# Patient Record
Sex: Male | Born: 1965 | Race: White | Hispanic: No | Marital: Married | Smoking: Never smoker
Health system: Southern US, Community
[De-identification: ages and names within clinical notes are randomized; demographics above are authoritative.]

## PROBLEM LIST (undated history)

## (undated) DIAGNOSIS — R002 Palpitations: Secondary | ICD-10-CM

## (undated) DIAGNOSIS — R079 Chest pain, unspecified: Secondary | ICD-10-CM

## (undated) DIAGNOSIS — R9439 Abnormal result of other cardiovascular function study: Secondary | ICD-10-CM

## (undated) DIAGNOSIS — E7800 Pure hypercholesterolemia, unspecified: Secondary | ICD-10-CM

## (undated) HISTORY — DX: Palpitations: R00.2

## (undated) HISTORY — DX: Chest pain, unspecified: R07.9

## (undated) HISTORY — DX: Pure hypercholesterolemia, unspecified: E78.00

## (undated) HISTORY — PX: KIDNEY STONE SURGERY: SHX686

## (undated) HISTORY — PX: CHOLECYSTECTOMY: SHX55

## (undated) HISTORY — DX: Abnormal result of other cardiovascular function study: R94.39

## (undated) HISTORY — PX: HAND SURGERY: SHX662

---

## 1998-07-15 ENCOUNTER — Other Ambulatory Visit: Admission: RE | Admit: 1998-07-15 | Discharge: 1998-07-15 | Payer: Self-pay | Admitting: Otolaryngology

## 1999-05-29 ENCOUNTER — Other Ambulatory Visit: Admission: RE | Admit: 1999-05-29 | Discharge: 1999-05-29 | Payer: Self-pay | Admitting: Otolaryngology

## 2001-07-15 ENCOUNTER — Emergency Department (HOSPITAL_COMMUNITY): Admission: EM | Admit: 2001-07-15 | Discharge: 2001-07-15 | Payer: Self-pay | Admitting: Emergency Medicine

## 2001-07-15 ENCOUNTER — Encounter: Payer: Self-pay | Admitting: Emergency Medicine

## 2005-06-05 ENCOUNTER — Encounter: Admission: RE | Admit: 2005-06-05 | Discharge: 2005-06-05 | Payer: Self-pay | Admitting: Neurosurgery

## 2005-07-28 ENCOUNTER — Observation Stay (HOSPITAL_COMMUNITY): Admission: RE | Admit: 2005-07-28 | Discharge: 2005-07-30 | Payer: Self-pay | Admitting: Neurosurgery

## 2005-08-01 ENCOUNTER — Emergency Department (HOSPITAL_COMMUNITY): Admission: EM | Admit: 2005-08-01 | Discharge: 2005-08-01 | Payer: Self-pay | Admitting: *Deleted

## 2010-09-09 ENCOUNTER — Emergency Department (HOSPITAL_COMMUNITY)
Admission: EM | Admit: 2010-09-09 | Discharge: 2010-09-09 | Disposition: A | Discharge status: Home / Self Care | Payer: BC Managed Care – PPO | Attending: Emergency Medicine | Admitting: Emergency Medicine

## 2010-09-09 ENCOUNTER — Emergency Department (HOSPITAL_COMMUNITY): Payer: BC Managed Care – PPO

## 2010-09-09 DIAGNOSIS — S6990XA Unspecified injury of unspecified wrist, hand and finger(s), initial encounter: Secondary | ICD-10-CM | POA: Insufficient documentation

## 2010-09-09 DIAGNOSIS — M79609 Pain in unspecified limb: Secondary | ICD-10-CM | POA: Insufficient documentation

## 2010-09-09 DIAGNOSIS — S61209A Unspecified open wound of unspecified finger without damage to nail, initial encounter: Secondary | ICD-10-CM | POA: Insufficient documentation

## 2010-09-09 DIAGNOSIS — IMO0002 Reserved for concepts with insufficient information to code with codable children: Secondary | ICD-10-CM | POA: Insufficient documentation

## 2010-09-09 DIAGNOSIS — M7989 Other specified soft tissue disorders: Secondary | ICD-10-CM | POA: Insufficient documentation

## 2013-02-11 ENCOUNTER — Encounter: Payer: Self-pay | Admitting: Cardiology

## 2013-02-11 DIAGNOSIS — M545 Low back pain, unspecified: Secondary | ICD-10-CM

## 2013-02-17 ENCOUNTER — Ambulatory Visit (INDEPENDENT_AMBULATORY_CARE_PROVIDER_SITE_OTHER): Payer: BC Managed Care – PPO | Admitting: Cardiology

## 2013-02-17 ENCOUNTER — Encounter: Payer: Self-pay | Admitting: Cardiology

## 2013-02-17 VITALS — BP 148/79 | HR 82 | Ht 69.0 in | Wt 178.0 lb

## 2013-02-17 DIAGNOSIS — I471 Supraventricular tachycardia: Secondary | ICD-10-CM

## 2013-02-17 DIAGNOSIS — R002 Palpitations: Secondary | ICD-10-CM

## 2013-02-17 DIAGNOSIS — I4719 Other supraventricular tachycardia: Secondary | ICD-10-CM | POA: Insufficient documentation

## 2013-02-17 HISTORY — DX: Palpitations: R00.2

## 2013-02-17 MED ORDER — DIGOXIN 125 MCG PO TABS: 0.1250 mg | ORAL_TABLET | Freq: Every day | ORAL | Status: DC

## 2013-02-17 NOTE — Progress Notes (Signed)
[address], [address] [place], [place]  [address] Phone: [phone] Fax:  [phone]  Date:  02/17/2013   ID:  Andrew Pierce, DOB 1965/07/26, MRN [id number]  PCP:  Farris Has MD  History of Present Illness: Andrew Pierce is a 47 y.o. male here for evaluation of palpitations, lightheadedness, chest pain. He recently had a friend with a myocardial infarction and he is concerned. Several years ago he is evaluated by cardiology he states. Palpitations have started over the past year and half. 12oz of coffee in AM. Tea. No soft drinks. Been taking Claritin. No D.   His palpitations seem to be associated with anxiety either at work or at home and he has been focusing on relaxation techniques. Usually episodes last approximately 1 minute or 2. He has not had any frank syncope. Occasionally he may feel lightheaded with these episodes. In the past, he did not tolerate metoprolol. He had no energy at that time. His blood pressure was low approximately 100/60 on this medication. He stopped taking it. Chest discomfort occasionally comes about with associated left arm pain as well. Usually mild episodes.  Now with palpitations having chest pain episodes. He stops what he is doing and folds his arms and sits. Sometimes comes on with stress.   Started baby ASA.   He is a former smoker no early family history of CAD.   Wt Readings from Last 3 Encounters:  02/17/13 178 lb (80.74 kg)     Past Medical History  Diagnosis Date  . Elevated LDL cholesterol level   . Palpitations 02/17/2013    Event monitor 05/03/12-occasional tachycardia, paroxysmal atrial tachycardia, PACs heart rate at times in the 170s. Symptomatic.  Marland Kitchen Chest pain     Past Surgical History  Procedure Laterality Date  . Hand surgery Left     middle finger removed-table saw accident  . Cholecystectomy    . Kidney stone surgery      , Removal    Current Outpatient Prescriptions  Medication Sig Dispense  Refill  . aspirin 81 MG tablet Take 81 mg by mouth daily.      . naproxen sodium (ANAPROX) 220 MG tablet Take 220 mg by mouth every 12 (twelve) hours as needed.       No current facility-administered medications for this visit.    Allergies:    Allergies  Allergen Reactions  . Flexeril [Cyclobenzaprine] Anaphylaxis    Social History:  The patient  reports that he has never smoked. He does not have any smokeless tobacco history on file. He reports that  drinks alcohol. He reports that he does not use illicit drugs.  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 Family History  Problem Relation Age of Onset  . Diabetes Mother   . Hypertension Father   . Heart disease Paternal Grandmother     ROS:  Please see the history of present illness.   Denies any fevers, chills, cough, syncope, orthopnea, PND, strokelike symptoms, bleeding, arthritic changes the   All other systems reviewed and negative.   PHYSICAL EXAM: VS:  BP 148/79  Pulse 82  Ht 5\' 9"  (1.753 m)  Wt 178 lb (80.74 kg)  BMI 26.27 kg/m2 Well nourished, well developed, in no acute distress HEENT: normal, [place]/AT, EOMI Neck: no JVD, normal carotid upstroke, no bruit Cardiac:  normal S1, S2; RRR; no murmur Lungs:  clear to auscultation bilaterally, no wheezing, rhonchi or rales Abd: soft, nontender, no hepatomegaly, no  bruits Ext: no edema, 2+ distal pulses Skin: warm and dry GU: deferred Neuro: no focal abnormalities noted, AAO x 3  EKG:  Normal sinus rhythm, no ST segment changes. Heart rate 75. 02/17/13   Event monitor: 12/13-  brief episodes of paroxysmal atrial tachycardia, PACs. Heart rate 170 during episodes of PAT. Symptomatic.  ASSESSMENT AND PLAN:  47 year old male with palpitations, prior event monitor demonstrating paroxysmal atrial tachycardia, atypical chest pain, anxiety  1. Chest pain - his chest pain may be occurring during episodes of paroxysmal atrial tachycardia, heart rate of 170 previously  seen on event monitor. I would like to check an exercise treadmill test to ensure that there is no evidence of ischemia. Continue with low-dose aspirin. 2. Paroxysmal atrial tachycardia-did not tolerate metoprolol well do to fatigue and blood pressure of 100 systolic. We will try digoxin 125 mcg once a day. This should not affect his blood pressure. Hopefully this will help suppress arrhythmia. Another option in the future may be calcium channel blocker. I will check an echocardiogram to for proper structure and function. Try to avoid excessive caffeine use, decongestants. 3. Stress/anxiety-continue with relaxation techniques.  We will see him back in 2 months.  Signed, Donato Schultz, MD [facility]  02/17/2013 11:26 AM

## 2013-02-17 NOTE — Patient Instructions (Signed)
Your physician has recommended you make the following change in your medication:   1. Start Digoxin .125 mcg once daily  Your physician has requested that you have an exercise tolerance test. For further information please visit [email or web address] Please also follow instruction sheet, as given.  Your physician has requested that you have an echocardiogram. Echocardiography is a painless test that uses sound waves to create images of your heart. It provides your doctor with information about the size and shape of your heart and how well your heart's chambers and valves are working. This procedure takes approximately one hour. There are no restrictions for this procedure.  Your physician recommends that you schedule a follow-up appointment in: 2 months with Dr. Anne Fu.

## 2013-03-24 ENCOUNTER — Ambulatory Visit (INDEPENDENT_AMBULATORY_CARE_PROVIDER_SITE_OTHER): Payer: BC Managed Care – PPO | Admitting: Cardiology

## 2013-03-24 ENCOUNTER — Ambulatory Visit (HOSPITAL_COMMUNITY): Payer: BC Managed Care – PPO | Attending: Cardiology | Admitting: Radiology

## 2013-03-24 ENCOUNTER — Encounter: Payer: Self-pay | Admitting: *Deleted

## 2013-03-24 ENCOUNTER — Encounter: Payer: Self-pay | Admitting: Cardiology

## 2013-03-24 ENCOUNTER — Encounter (HOSPITAL_COMMUNITY): Payer: Self-pay | Admitting: Pharmacy Technician

## 2013-03-24 ENCOUNTER — Encounter (INDEPENDENT_AMBULATORY_CARE_PROVIDER_SITE_OTHER): Payer: Self-pay

## 2013-03-24 DIAGNOSIS — R9439 Abnormal result of other cardiovascular function study: Secondary | ICD-10-CM

## 2013-03-24 DIAGNOSIS — I4891 Unspecified atrial fibrillation: Secondary | ICD-10-CM | POA: Insufficient documentation

## 2013-03-24 DIAGNOSIS — Z8249 Family history of ischemic heart disease and other diseases of the circulatory system: Secondary | ICD-10-CM | POA: Insufficient documentation

## 2013-03-24 DIAGNOSIS — R000 Tachycardia, unspecified: Secondary | ICD-10-CM | POA: Insufficient documentation

## 2013-03-24 DIAGNOSIS — R002 Palpitations: Secondary | ICD-10-CM

## 2013-03-24 DIAGNOSIS — I471 Supraventricular tachycardia: Secondary | ICD-10-CM

## 2013-03-24 LAB — CBC WITH DIFFERENTIAL/PLATELET
Basophils Absolute: 0 10*3/uL (ref 0.0–0.1)
Eosinophils Absolute: 0.2 10*3/uL (ref 0.0–0.7)
Eosinophils Relative: 3.7 % (ref 0.0–5.0)
MCV: 90.9 fl (ref 78.0–100.0)
Monocytes Absolute: 0.4 10*3/uL (ref 0.1–1.0)
Neutrophils Relative %: 54.5 % (ref 43.0–77.0)
Platelets: 255 10*3/uL (ref 150.0–400.0)
WBC: 6.1 10*3/uL (ref 4.5–10.5)

## 2013-03-24 LAB — BASIC METABOLIC PANEL
BUN: 11 mg/dL (ref 6–23)
Chloride: 102 mEq/L (ref 96–112)
Creatinine, Ser: 0.9 mg/dL (ref 0.4–1.5)

## 2013-03-24 LAB — PROTIME-INR
INR: 1 ratio (ref 0.8–1.0)
Prothrombin Time: 10.6 s (ref 10.2–12.4)

## 2013-03-24 MED ORDER — SODIUM CHLORIDE 0.9 % IV SOLN: INTRAVENOUS | Status: DC

## 2013-03-24 NOTE — Progress Notes (Signed)
Exercise Treadmill Test  Pre-Exercise Testing Evaluation Rhythm: normal sinus  Rate: 65 BPM     Test  Exercise Tolerance Test Ordering MD: Donato Schultz, MD  Interpreting MD: Donato Schultz, MD  Unique Test No: 1  Treadmill:  1  Indication for ETT: palpitations  Contraindication to ETT: No   Stress Modality: exercise - treadmill  Cardiac Imaging Performed: non   Protocol: standard Bruce - maximal  Max BP:  153/63  Max MPHR (bpm):  172 85% MPR (bpm):  146  MPHR obtained (bpm):  153 % MPHR obtained:  88   Reached 85% MPHR (min:sec):  9 minutes 48 seconds  Total Exercise Time (min-sec):  10 minutes 18 seconds   Workload in METS:  12.2 Borg Scale: 19  Reason ETT Terminated:  atypical chest pain    ST Segment Analysis At Rest: normal ST segments - no evidence of significant ST depression With Exercise: significant ischemic ST depression seen in V5, as well as inferior leads  Other Information Arrhythmia:  No Angina during ETT:  present (1) Quality of ETT:  diagnostic  ETT Interpretation:  abnormal - evidence of ST depression consistent with ischemia  Comments: ST segment depression mostly horizontal noted during peak stress. He also had what he describes as intense/sharp chest discomfort at peak stress that resolved after approximately 1 minute at rest. He has been noticing this chest discomfort at home occasionally with exertional activity. He is also noted this when he feels his heart "racing". Because of his strong family history, abnormal treadmill findings, chest pain we will proceed with heart catheterization.  Recommendations: I discussed heart catheterization with him, risks and benefits including stroke, heart attack, death, renal impairment, bleeding. We will proceed with radial artery approach. Questions were answered.

## 2013-03-24 NOTE — Progress Notes (Signed)
Echocardiogram performed.  

## 2013-03-24 NOTE — Patient Instructions (Signed)
Your physician has requested that you have a Left cardiac catheterization. Cardiac catheterization is used to diagnose and/or treat various heart conditions. Doctors may recommend this procedure for a number of different reasons. The most common reason is to evaluate chest pain. Chest pain can be a symptom of coronary artery disease (CAD), and cardiac catheterization can show whether plaque is narrowing or blocking your heart's arteries. This procedure is also used to evaluate the valves, as well as measure the blood flow and oxygen levels in different parts of your heart. For further information please visit [email or web address] Please follow instruction sheet, as given.  Your physician recommends that you have Labs today: BMET,CBC,INR  Your physician recommends that you continue on your current medications as directed. Please refer to the Current Medication list given to you today.

## 2013-03-27 ENCOUNTER — Other Ambulatory Visit: Payer: Self-pay

## 2013-03-27 ENCOUNTER — Encounter (HOSPITAL_COMMUNITY)
Admission: RE | Disposition: A | Discharge status: Home / Self Care | Payer: Self-pay | Source: Ambulatory Visit | Attending: Cardiology

## 2013-03-27 ENCOUNTER — Ambulatory Visit (HOSPITAL_COMMUNITY)
Admission: RE | Admit: 2013-03-27 | Discharge: 2013-03-27 | Disposition: A | Discharge status: Home / Self Care | Payer: BC Managed Care – PPO | Source: Ambulatory Visit | Attending: Cardiology | Admitting: Cardiology

## 2013-03-27 DIAGNOSIS — R9439 Abnormal result of other cardiovascular function study: Secondary | ICD-10-CM | POA: Diagnosis present

## 2013-03-27 DIAGNOSIS — R0789 Other chest pain: Secondary | ICD-10-CM | POA: Insufficient documentation

## 2013-03-27 DIAGNOSIS — Z87891 Personal history of nicotine dependence: Secondary | ICD-10-CM | POA: Insufficient documentation

## 2013-03-27 DIAGNOSIS — R079 Chest pain, unspecified: Secondary | ICD-10-CM | POA: Diagnosis present

## 2013-03-27 HISTORY — PX: LEFT HEART CATHETERIZATION WITH CORONARY ANGIOGRAM: SHX5451

## 2013-03-27 SURGERY — LEFT HEART CATHETERIZATION WITH CORONARY ANGIOGRAM
Anesthesia: LOCAL

## 2013-03-27 MED ORDER — HEPARIN (PORCINE) IN NACL 2-0.9 UNIT/ML-% IJ SOLN
INTRAMUSCULAR | Status: AC
  Filled 2013-03-27: qty 1000

## 2013-03-27 MED ORDER — SODIUM CHLORIDE 0.9 % IV SOLN
INTRAVENOUS | Status: DC
  Administered 2013-03-27: 07:00:00 via INTRAVENOUS

## 2013-03-27 MED ORDER — ONDANSETRON HCL 4 MG/2ML IJ SOLN
4.0000 mg | Freq: Four times a day (QID) | INTRAMUSCULAR | Status: DC | PRN
Start: 2013-03-27 — End: 2013-03-27

## 2013-03-27 MED ORDER — SODIUM CHLORIDE 0.9 % IJ SOLN: 3.0000 mL | Freq: Two times a day (BID) | INTRAMUSCULAR | Status: DC

## 2013-03-27 MED ORDER — VERAPAMIL HCL 2.5 MG/ML IV SOLN
INTRAVENOUS | Status: AC
  Filled 2013-03-27: qty 2

## 2013-03-27 MED ORDER — DIAZEPAM 5 MG PO TABS
5.0000 mg | ORAL_TABLET | ORAL | Status: AC
  Administered 2013-03-27: 5 mg via ORAL

## 2013-03-27 MED ORDER — ACETAMINOPHEN 325 MG PO TABS: 650.0000 mg | ORAL_TABLET | ORAL | Status: DC | PRN

## 2013-03-27 MED ORDER — MIDAZOLAM HCL 2 MG/2ML IJ SOLN
INTRAMUSCULAR | Status: AC
  Filled 2013-03-27: qty 2

## 2013-03-27 MED ORDER — SODIUM CHLORIDE 0.9 % IJ SOLN: 3.0000 mL | INTRAMUSCULAR | Status: DC | PRN

## 2013-03-27 MED ORDER — DIAZEPAM 5 MG PO TABS
ORAL_TABLET | ORAL | Status: AC
  Filled 2013-03-27: qty 1

## 2013-03-27 MED ORDER — NITROGLYCERIN 0.2 MG/ML ON CALL CATH LAB
INTRAVENOUS | Status: AC
  Filled 2013-03-27: qty 1

## 2013-03-27 MED ORDER — FENTANYL CITRATE 0.05 MG/ML IJ SOLN
INTRAMUSCULAR | Status: AC
  Filled 2013-03-27: qty 2

## 2013-03-27 MED ORDER — SODIUM CHLORIDE 0.9 % IV SOLN: 1.0000 mL/kg/h | INTRAVENOUS | Status: DC

## 2013-03-27 MED ORDER — LIDOCAINE HCL (PF) 1 % IJ SOLN
INTRAMUSCULAR | Status: AC
  Filled 2013-03-27: qty 30

## 2013-03-27 MED ORDER — HEPARIN SODIUM (PORCINE) 1000 UNIT/ML IJ SOLN
INTRAMUSCULAR | Status: AC
  Filled 2013-03-27: qty 1

## 2013-03-27 MED ORDER — SODIUM CHLORIDE 0.9 % IV SOLN: 250.0000 mL | INTRAVENOUS | Status: DC | PRN

## 2013-03-27 MED ORDER — ASPIRIN 81 MG PO CHEW: 81.0000 mg | CHEWABLE_TABLET | ORAL | Status: DC

## 2013-03-27 NOTE — H&P (Signed)
[address]  [place], [place] [address]  Phone: [phone]  Fax: [phone]  Date: 02/17/2013  ID: Andrew Pierce, DOB 1965-09-26, MRN [id number]  PCP: Farris Has MD  History of Present Illness:  Andrew Pierce is a 47 y.o. male here for evaluation of palpitations, lightheadedness, chest pain. He recently had a friend with a myocardial infarction and he is concerned. Several years ago he is evaluated by cardiology he states. Palpitations have started over the past year and half. 12oz of coffee in AM. Tea. No soft drinks. Been taking Claritin. No D.  His palpitations seem to be associated with anxiety either at work or at home and he has been focusing on relaxation techniques. Usually episodes last approximately 1 minute or 2. He has not had any frank syncope. Occasionally he may feel lightheaded with these episodes. In the past, he did not tolerate metoprolol. He had no energy at that time. His blood pressure was low approximately 100/60 on this medication. He stopped taking it. Chest discomfort occasionally comes about with associated left arm pain as well. Usually mild episodes.  Now with palpitations having chest pain episodes. He stops what he is doing and folds his arms and sits. Sometimes comes on with stress.  Started baby ASA.  He is a former smoker no early family history of CAD.  Wt Readings from Last 3 Encounters:   02/17/13  178 lb (80.74 kg)    Past Medical History   Diagnosis  Date   .  Elevated LDL cholesterol level    .  Palpitations  02/17/2013     Event monitor 05/03/12-occasional tachycardia, paroxysmal atrial tachycardia, PACs heart rate at times in the 170s. Symptomatic.   Marland Kitchen  Chest pain     Past Surgical History   Procedure  Laterality  Date   .  Hand surgery  Left      middle finger removed-table saw accident   .  Cholecystectomy     .  Kidney stone surgery       , Removal    Current Outpatient Prescriptions   Medication  Sig  Dispense   Refill   .  aspirin 81 MG tablet  Take 81 mg by mouth daily.     .  naproxen sodium (ANAPROX) 220 MG tablet  Take 220 mg by mouth every 12 (twelve) hours as needed.      No current facility-administered medications for this visit.   Allergies:  Allergies   Allergen  Reactions   .  Flexeril [Cyclobenzaprine]  Anaphylaxis   Social History: The patient reports that he has never smoked. He does not have any smokeless tobacco history on file. He reports that drinks alcohol. He reports that he does not use illicit drugs.  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  Family History   Problem  Relation  Age of Onset   .  Diabetes  Mother    .  Hypertension  Father    .  Heart disease  Paternal Grandmother    ROS: Please see the history of present illness. Denies any fevers, chills, cough, syncope, orthopnea, PND, strokelike symptoms, bleeding, arthritic changes the All other systems reviewed and negative.  PHYSICAL EXAM:  VS: BP 148/79  Pulse 82  Ht 5\' 9"  (1.753 m)  Wt 178 lb (80.74 kg)  BMI 26.27 kg/m2  Well nourished, well developed, in no acute distress  HEENT: normal, [place]/AT, EOMI  Neck: no JVD, normal carotid upstroke,  no bruit  Cardiac: normal S1, S2; RRR; no murmur  Lungs: clear to auscultation bilaterally, no wheezing, rhonchi or rales  Abd: soft, nontender, no hepatomegaly, no bruits  Ext: no edema, 2+ distal pulses  Skin: warm and dry  GU: deferred  Neuro: no focal abnormalities noted, AAO x 3  EKG: Normal sinus rhythm, no ST segment changes. Heart rate 75. 02/17/13  Event monitor: 12/13- brief episodes of paroxysmal atrial tachycardia, PACs. Heart rate 170 during episodes of PAT. Symptomatic.  ASSESSMENT AND PLAN:  47 year old male with palpitations, prior event monitor demonstrating paroxysmal atrial tachycardia, atypical chest pain, anxiety  1. Chest pain - his chest pain may be occurring during episodes of paroxysmal atrial tachycardia, heart rate of 170 previously  seen on event monitor. I would like to check an exercise treadmill test to ensure that there is no evidence of ischemia. Continue with low-dose aspirin. 2. Paroxysmal atrial tachycardia-did not tolerate metoprolol well do to fatigue and blood pressure of 100 systolic. We will try digoxin 125 mcg once a day. This should not affect his blood pressure. Hopefully this will help suppress arrhythmia. Another option in the future may be calcium channel blocker. I will check an echocardiogram to for proper structure and function. Try to avoid excessive caffeine use, decongestants. 3. Stress/anxiety-continue with relaxation techniques. 4. ADDENDUM: ETT was abnormal, CP at peak exercise with ST depression in V5 as well as inferior leads. Proceed with cath. Discussed with patient.

## 2013-03-27 NOTE — Interval H&P Note (Signed)
History and Physical Interval Note:  03/27/2013 7:36 AM  Andrew Pierce  has presented today for surgery, with the diagnosis of abnormal stress  The various methods of treatment have been discussed with the patient and family. After consideration of risks, benefits and other options for treatment, the patient has consented to  Procedure(s): LEFT HEART CATHETERIZATION WITH CORONARY ANGIOGRAM (N/A) as a surgical intervention .  The patient's history has been reviewed, patient examined, no change in status, stable for surgery.  I have reviewed the patient's chart and labs.  Questions were answered to the patient's satisfaction.     SKAINS, MARK

## 2013-03-27 NOTE — Progress Notes (Signed)
Left message for patient to call the office

## 2013-03-27 NOTE — Interval H&P Note (Signed)
Cath Lab Visit (complete for each Cath Lab visit)  Clinical Evaluation Leading to the Procedure:   ACS: no  Non-ACS:    Anginal Classification: CCS II  Anti-ischemic medical therapy: No Therapy  Non-Invasive Test Results: High-risk stress test findings: cardiac mortality >3%/year ST depression, CP at peak stress.   Prior CABG: No previous CABG      History and Physical Interval Note:  03/27/2013 9:53 AM  Andrew Pierce  has presented today for surgery, with the diagnosis of abnormal stress  The various methods of treatment have been discussed with the patient and family. After consideration of risks, benefits and other options for treatment, the patient has consented to  Procedure(s): LEFT HEART CATHETERIZATION WITH CORONARY ANGIOGRAM (N/A) as a surgical intervention .  The patient's history has been reviewed, patient examined, no change in status, stable for surgery.  I have reviewed the patient's chart and labs.  Questions were answered to the patient's satisfaction.     Torianne Laflam

## 2013-03-27 NOTE — Progress Notes (Signed)
Patient ID: Andrew Pierce, male   DOB: 1965-05-26, 47 y.o.   MRN: [id number]  Please see ETT report for details  Abnormal ETT CP/Angina ST depression DIscussed Cath

## 2013-03-27 NOTE — CV Procedure (Signed)
    CARDIAC CATHETERIZATION  PROCEDURE:  Left heart catheterization with selective coronary angiography, left ventriculogram via the radial artery approach.  INDICATIONS:  47 year old male with chest discomfort, left arm discomfort at peak exercise also with chest discomfort possibly associated with supraventricular tachycardia who underwent exercise treadmill test and at peak exercise he developed sudden onset chest discomfort with left arm radiation lasting approximately 1 minute with concomitant ST segment depression on EKG. Because of this abnormal stress test, he was taken to the cardiac catheterization lab for further evaluation. Family history as well.  The risks, benefits, and details of the procedure were explained to the patient, including possibilities of stroke, heart attack, death, renal impairment, arterial damage, bleeding.  The patient verbalized understanding and wanted to proceed.  Informed written consent was obtained.  PROCEDURE TECHNIQUE:  Allen's test was performed pre-and post procedure and was normal. The right radial artery site was prepped and draped in a sterile fashion. One percent lidocaine was used for local anesthesia. Using the modified Seldinger technique a 5 French hydrophilic sheath was inserted into the radial artery without difficulty. 3 mg of verapamil was administered via the sheath. A Judkins right #4 catheter with the guidance of a Versicore wire was placed in the right coronary cusp and selectively cannulated the right coronary artery. After traversing the aortic arch, 4000 units of heparin IV was administered. A Judkins left #3.5 catheter was used to selectively cannulate the left main artery. Multiple views with hand injection of Omnipaque were obtained. Catheter a pigtail catheter was used to cross into the left ventricle, hemodynamics were obtained, and a left ventriculogram was performed in the RAO position with power injection. Following the procedure, sheath  was removed, patient was hemodynamically stable, hemostasis was maintained with a Terumo T band.   CONTRAST:  Total of 60 ml.    FLOUROSCOPY TIME: 3.6 min.  COMPLICATIONS:  None.    HEMODYNAMICS:  Aortic pressure was 107/48mmHg; LV systolic pressure was ; LVEDP .  There was no gradient between the left ventricle and aorta.    ANGIOGRAPHIC DATA:    Left main: No angiographically significant coronary artery disease.  Left anterior descending (LAD): No angiographically significant coronary artery disease. One significant diagonal branch.  Circumflex artery (CIRC): No angiographically significant coronary artery disease. 2 obtuse marginal branches  Right coronary artery (RCA): No angiographically significant coronary artery disease. Dominant vessel giving rise to PDA  LEFT VENTRICULOGRAM:  Left ventricular angiogram was done in the 30 RAO projection and revealed normal left ventricular wall motion and systolic function with an estimated ejection fraction of 65 %.   IMPRESSIONS:  No angiographically significant CAD Normal left ventricular systolic function.  LVEDP 16 mmHg.  Ejection fraction 65%.  RECOMMENDATION:  Continue with medical management. Reassurance. Overall false positive exercise treadmill test. Continue with aggressive primary prevention. He may resume to work Thursday.

## 2013-04-21 ENCOUNTER — Encounter: Payer: Self-pay | Admitting: Cardiology

## 2013-04-21 ENCOUNTER — Encounter (INDEPENDENT_AMBULATORY_CARE_PROVIDER_SITE_OTHER): Payer: Self-pay

## 2013-04-21 ENCOUNTER — Ambulatory Visit (INDEPENDENT_AMBULATORY_CARE_PROVIDER_SITE_OTHER): Payer: BC Managed Care – PPO | Admitting: Cardiology

## 2013-04-21 VITALS — BP 128/84 | HR 72 | Ht 69.0 in | Wt 182.4 lb

## 2013-04-21 DIAGNOSIS — R002 Palpitations: Secondary | ICD-10-CM

## 2013-04-21 DIAGNOSIS — R079 Chest pain, unspecified: Secondary | ICD-10-CM

## 2013-04-21 NOTE — Progress Notes (Signed)
[address] [place], [place]  [address] Phone: [phone] Fax:  [phone]  Date:  04/21/2013   ID:  ROGEN PORTE, DOB 02-14-1966, MRN [id number]  PCP:  Farris Has, MD MD  History of Present Illness: Andrew Pierce is a 47 y.o. male here for evaluation of palpitations, lightheadedness, chest pain. He recently had a friend with a myocardial infarction and he is concerned.  He underwent exercise treadmill test which was false positive, ST segment depression. Cardiac catheterization on 03/27/13 demonstrated normal arteries. Normal EF. Palpitations have started over the past year and half. 12oz of coffee in AM. Tea. No soft drinks. Been taking Claritin. No D.   His palpitations seem to be associated with anxiety either at work or at home and he has been focusing on relaxation techniques. Usually episodes last approximately 1 minute or 2. He has not had any frank syncope. Occasionally he may feel lightheaded with these episodes. In the past, he did not tolerate metoprolol. He had no energy at that time. His blood pressure was low approximately 100/60 on this medication. He stopped taking it. Chest discomfort occasionally comes about with associated left arm pain as well. Usually mild episodes.  Now with palpitations having chest pain episodes. He stops what he is doing and folds his arms and sits. Sometimes comes on with stress. Stressors at [employer].  Started baby ASA.   He is a former smoker no early family history of CAD.   Wt Readings from Last 3 Encounters:  04/21/13 182 lb 6.4 oz (82.736 kg)  03/27/13 171 lb (77.565 kg)  03/27/13 171 lb (77.565 kg)     Past Medical History  Diagnosis Date  . Elevated LDL cholesterol level   . Palpitations 02/17/2013    Event monitor 05/03/12-occasional tachycardia, paroxysmal atrial tachycardia, PACs heart rate at times in the 170s. Symptomatic.  Marland Kitchen Chest pain   . Abnormal stress test     Past Surgical History   Procedure Laterality Date  . Hand surgery Left     middle finger removed-table saw accident  . Cholecystectomy    . Kidney stone surgery      , Removal    Current Outpatient Prescriptions  Medication Sig Dispense Refill  . aspirin 81 MG tablet Take 81 mg by mouth daily.      . digoxin (LANOXIN) 0.125 MG tablet Take 1 tablet (0.125 mg total) by mouth daily.  30 tablet  3  . naproxen sodium (ALEVE) 220 MG tablet Take 220 mg by mouth daily as needed.       Current Facility-Administered Medications  Medication Dose Route Frequency Provider Last Rate Last Dose  . 0.9 %  sodium chloride infusion   Intravenous Continuous Donato Schultz, MD        Allergies:    Allergies  Allergen Reactions  . Flexeril [Cyclobenzaprine] Anaphylaxis    Social History:  The patient  reports that he has never smoked. He does not have any smokeless tobacco history on file. He reports that he drinks alcohol. He reports that he does not use illicit drugs.  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 Family History  Problem Relation Age of Onset  . Diabetes Mother   . Hypertension Father   . Heart disease Paternal Grandmother     ROS:  Please see the history of present illness.   Denies any fevers, chills, cough, syncope, orthopnea, PND, strokelike symptoms, bleeding, arthritic changes the  All other systems reviewed and negative.   PHYSICAL EXAM: VS:  BP 128/84  Pulse 72  Ht 5\' 9"  (1.753 m)  Wt 182 lb 6.4 oz (82.736 kg)  BMI 26.92 kg/m2 Well nourished, well developed, in no acute distress HEENT: normal, [place]/AT, EOMI Neck: no JVD, normal carotid upstroke, no bruit Cardiac:  normal S1, S2; RRR; no murmur Lungs:  clear to auscultation bilaterally, no wheezing, rhonchi or rales Abd: soft, nontender, no hepatomegaly, no bruits Ext: no edema, 2+ distal pulses Skin: warm and dry GU: deferred Neuro: no focal abnormalities noted, AAO x 3  EKG:  Normal sinus rhythm, no ST segment changes. Heart  rate 72. No changes   Event monitor: 12/13-  brief episodes of paroxysmal atrial tachycardia, PACs. Heart rate 170 during episodes of PAT. Symptomatic.  ASSESSMENT AND PLAN:  47 year old male with palpitations, prior event monitor demonstrating paroxysmal atrial tachycardia, atypical chest pain, anxiety  1. Chest pain - reassuring cardiac catheterization. No CAD. His chest pain may be occurring during episodes of paroxysmal atrial tachycardia, heart rate of 170 previously seen on event monitor. Pulse positive ETT. Likely anxiety driven. Continue with low-dose aspirin. 2. Paroxysmal atrial tachycardia-did not tolerate metoprolol well do to fatigue and blood pressure of 100 systolic. Digoxin 125 mcg once a day. Doing well with this.This should not affect his blood pressure. Hopefully this will help suppress arrhythmia. Another option in the future may be calcium channel blocker. I will check an echocardiogram to for proper structure and function. Try to avoid excessive caffeine use, decongestants. 3. Stress/anxiety-continue with relaxation techniques. 4. We will see back in 6 months given digoxin use.   Signed, Donato Schultz, MD [facility]  04/21/2013 10:06 AM

## 2013-04-21 NOTE — Patient Instructions (Signed)
Your physician recommends that you continue on your current medications as directed. Please refer to the Current Medication list given to you today.  Your physician wants you to follow-up in: 6 months with Dr. Skains. You will receive a reminder letter in the mail two months in advance. If you don't receive a letter, please call our office to schedule the follow-up appointment.  

## 2013-05-12 ENCOUNTER — Ambulatory Visit: Payer: BC Managed Care – PPO | Admitting: Cardiology

## 2013-07-18 ENCOUNTER — Other Ambulatory Visit: Payer: Self-pay

## 2013-07-18 MED ORDER — DIGOXIN 125 MCG PO TABS
0.1250 mg | ORAL_TABLET | Freq: Every day | ORAL | Status: DC
Start: 2013-07-18 — End: 2014-08-29

## 2014-04-26 ENCOUNTER — Encounter (HOSPITAL_COMMUNITY): Payer: Self-pay | Admitting: Cardiology

## 2014-08-29 ENCOUNTER — Other Ambulatory Visit: Payer: Self-pay | Admitting: Cardiology

## 2014-08-29 NOTE — Telephone Encounter (Signed)
OK to refill # 30 X 2.  Pt needs an appointment

## 2014-09-03 ENCOUNTER — Encounter: Payer: Self-pay | Admitting: Cardiology

## 2014-09-03 ENCOUNTER — Ambulatory Visit (INDEPENDENT_AMBULATORY_CARE_PROVIDER_SITE_OTHER): Payer: 59 | Admitting: Cardiology

## 2014-09-03 VITALS — BP 118/60 | HR 76 | Ht 69.0 in | Wt 188.0 lb

## 2014-09-03 DIAGNOSIS — R002 Palpitations: Secondary | ICD-10-CM

## 2014-09-03 DIAGNOSIS — I471 Supraventricular tachycardia: Secondary | ICD-10-CM | POA: Diagnosis not present

## 2014-09-03 NOTE — Patient Instructions (Signed)
The current medical regimen is effective;  continue present plan and medications.  Follow up in 1 year with Dr. Skains.  You will receive a letter in the mail 2 months before you are due.  Please call us when you receive this letter to schedule your follow up appointment.  Thank you for choosing [facility]!!     

## 2014-09-03 NOTE — Progress Notes (Signed)
[address] [place], [place]  [address] Phone: [phone] Fax:  [phone]  Date:  09/03/2014   ID:  Andrew Pierce, DOB 09/19/1965, MRN [id number]  PCP:  Farris Has, MD MD  History of Present Illness: Andrew Pierce is a 49 y.o. male here for evaluation of palpitations, lightheadedness, chest pain. He recently had a friend with a myocardial infarction and he is concerned.  He underwent exercise treadmill test which was false positive, ST segment depression. Cardiac catheterization on 03/27/13 demonstrated normal arteries. Normal EF. Palpitations have started over the past year and half. 12oz of coffee in AM. Tea. No soft drinks. Been taking Claritin. No D.   His palpitations seem to be associated with anxiety either at work or at home and he has been focusing on relaxation techniques. Usually episodes last approximately 1 minute or 2. He has not had any frank syncope. Occasionally he may feel lightheaded with these episodes. In the past, he did not tolerate metoprolol. He had no energy at that time. His blood pressure was low approximately 100/60 on this medication. He stopped taking it. Chest discomfort occasionally comes about with associated left arm pain as well. Usually mild episodes.  Now with palpitations having chest pain episodes. He stops what he is doing and folds his arms and sits. Sometimes comes on with stress. Stressors at [employer].  Started baby ASA.   He is a former smoker no early family history of CAD.  09/03/14-very rarely will he feel palpitations, occasionally with anxiety symptoms. Overall he has been doing very well on his digoxin therapy. No chest pain, no syncope, no shortness of breath.   Wt Readings from Last 3 Encounters:  09/03/14 188 lb (85.276 kg)  04/21/13 182 lb 6.4 oz (82.736 kg)  03/27/13 171 lb (77.565 kg)     Past Medical History  Diagnosis Date  . Elevated LDL cholesterol level   . Palpitations 02/17/2013   Event monitor 05/03/12-occasional tachycardia, paroxysmal atrial tachycardia, PACs heart rate at times in the 170s. Symptomatic.  Marland Kitchen Chest pain   . Abnormal stress test     Past Surgical History  Procedure Laterality Date  . Hand surgery Left     middle finger removed-table saw accident  . Cholecystectomy    . Kidney stone surgery      , Removal  . Left heart catheterization with coronary angiogram N/A 03/27/2013    Procedure: LEFT HEART CATHETERIZATION WITH CORONARY ANGIOGRAM;  Surgeon: Donato Schultz, MD;  Location: [facility] CATH LAB;  Service: Cardiovascular;  Laterality: N/A;    Current Outpatient Prescriptions  Medication Sig Dispense Refill  . aspirin 81 MG tablet Take 81 mg by mouth daily.    Marland Kitchen DIGOX 125 MCG tablet TAKE 1 TABLET BY MOUTH EVERY DAY 30 tablet 2  . naproxen sodium (ALEVE) 220 MG tablet Take 220 mg by mouth daily as needed.     Current Facility-Administered Medications  Medication Dose Route Frequency Provider Last Rate Last Dose  . 0.9 %  sodium chloride infusion   Intravenous Continuous Jake Bathe, MD        Allergies:    Allergies  Allergen Reactions  . Flexeril [Cyclobenzaprine] Anaphylaxis    Social History:  The patient  reports that he has never smoked. He does not have any smokeless tobacco history on file. He reports that he drinks alcohol. He reports that he does not use illicit drugs.  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 Family History  Problem Relation Age of Onset  . Diabetes Mother   . Hypertension Father   . Heart disease Paternal Grandmother   . Heart attack Paternal Grandmother   . Stroke Paternal Grandmother     ROS:  Please see the history of present illness.   Denies any fevers, chills, cough, syncope, orthopnea, PND, strokelike symptoms, bleeding, arthritic changes the   All other systems reviewed and negative.   PHYSICAL EXAM: VS:  BP 118/60 mmHg  Pulse 76  Ht 5\' 9"  (1.753 m)  Wt 188 lb (85.276 kg)  BMI 27.75  kg/m2 Well nourished, well developed, in no acute distress HEENT: normal, [place]/AT, EOMI Neck: no JVD, normal carotid upstroke, no bruit Cardiac:  normal S1, S2; RRR; no murmur Lungs:  clear to auscultation bilaterally, no wheezing, rhonchi or rales Abd: soft, nontender, no hepatomegaly, no bruits Ext: no edema, 2+ distal pulses Skin: warm and dry GU: deferred Neuro: no focal abnormalities noted, AAO x 3  EKG: 09/03/14-sinus rhythm, 76, no other abnormalities. Normal sinus rhythm, no ST segment changes. Heart rate 72. No changes   Event monitor: 12/13-  brief episodes of paroxysmal atrial tachycardia, PACs. Heart rate 170 during episodes of PAT. Symptomatic.  ECHO 03/24/13 - 50% EF.  ASSESSMENT AND PLAN:  49 year old male with palpitations, prior event monitor demonstrating paroxysmal atrial tachycardia, atypical chest pain, anxiety  1. No further Chest pain - reassuring cardiac catheterization. No CAD. His chest pain may be occurring during episodes of paroxysmal atrial tachycardia, heart rate of 170 previously seen on event monitor. False positive ETT. Likely anxiety driven. Continue with low-dose aspirin. 2. Paroxysmal atrial tachycardia-did not tolerate metoprolol well do to fatigue and blood pressure of 100 systolic. Digoxin 125 mcg once a day. Doing well with this.This should not affect his blood pressure. Hopefully this will help suppress arrhythmia. Another option in the future may be calcium channel blocker. Try to avoid excessive caffeine use, decongestants. 3. Stress/anxiety-continue with relaxation techniques. 4. We will see back in 12 months given digoxin use.   Signed, Donato SchultzMark Skains, MD [facility]  09/03/2014 4:12 PM

## 2014-11-23 ENCOUNTER — Telehealth: Payer: Self-pay | Admitting: Cardiology

## 2014-11-23 ENCOUNTER — Encounter: Payer: Self-pay | Admitting: Cardiology

## 2014-11-23 NOTE — Telephone Encounter (Signed)
Walk in pt form-[facility] paper dropped off pt states need done today. Gave to Pam/Skains

## 2014-11-23 NOTE — Telephone Encounter (Signed)
Dr.Skains completed letter for pt for [facility] scanned in faxed and left pt VM making him aware.

## 2015-03-06 ENCOUNTER — Other Ambulatory Visit: Payer: Self-pay | Admitting: Cardiology

## 2015-09-16 ENCOUNTER — Encounter: Payer: Self-pay | Admitting: Cardiology

## 2015-09-16 ENCOUNTER — Ambulatory Visit (INDEPENDENT_AMBULATORY_CARE_PROVIDER_SITE_OTHER): Payer: BLUE CROSS/BLUE SHIELD | Admitting: Cardiology

## 2015-09-16 VITALS — BP 126/74 | HR 73 | Ht 69.0 in | Wt 203.0 lb

## 2015-09-16 DIAGNOSIS — R002 Palpitations: Secondary | ICD-10-CM | POA: Diagnosis not present

## 2015-09-16 DIAGNOSIS — I471 Supraventricular tachycardia: Secondary | ICD-10-CM

## 2015-09-16 DIAGNOSIS — G4489 Other headache syndrome: Secondary | ICD-10-CM | POA: Diagnosis not present

## 2015-09-16 NOTE — Progress Notes (Signed)
[address] [place], [place]  [address] Phone: [phone] Fax:  [phone]  Date:  09/16/2015   ID:  Andrew Pierce, DOB [birth year], MRN [id number]  PCP:  Andrew HasMORROW, AARON, MD MD  History of Present Illness: Andrew Pierce is a 50 y.o. male here for evaluation of palpitations, lightheadedness, chest pain. He recently had a friend with a myocardial infarction and he is concerned.  He underwent exercise treadmill test which was false positive, ST segment depression. Cardiac catheterization on 03/27/13 demonstrated normal arteries. Normal EF. Palpitations have started over the past year and half. 12oz of coffee in AM. Tea. No soft drinks. Been taking Claritin. No D.   His palpitations seem to be associated with anxiety either at work or at home and he has been focusing on relaxation techniques. Usually episodes last approximately 1 minute or 2. He has not had any frank syncope. Occasionally he may feel lightheaded with these episodes. In the past, he did not tolerate metoprolol. He had no energy at that time. His blood pressure was low approximately 100/60 on this medication. He stopped taking it. Chest discomfort occasionally comes about with associated left arm pain as well. Usually mild episodes.  Now with palpitations having chest pain episodes. He stops what he is doing and folds his arms and sits. Sometimes comes on with stress. Stressors at [employer]/home.  Started baby ASA.   He is a former smoker no early family history of CAD.  09/03/14-very rarely will he feel palpitations, occasionally with anxiety symptoms. Overall he has been doing very well on his digoxin therapy. No chest pain, no syncope, no shortness of breath.  09/16/15 - Bad HA past 2 weeks. Goodies helped a little. +SOB, fatigue, CP again. Wonders if barbeque triggered it. Left sided HA. Improved. No palpitations. He did feel that his headache worsened with activity. His blood pressure has been normal. EKG  today reassuring.   Wt Readings from Last 3 Encounters:  09/16/15 203 lb (92.08 kg)  09/03/14 188 lb (85.276 kg)  04/21/13 182 lb 6.4 oz (82.736 kg)     Past Medical History  Diagnosis Date  . Elevated LDL cholesterol level   . Palpitations 02/17/2013    Event monitor 05/03/12-occasional tachycardia, paroxysmal atrial tachycardia, PACs heart rate at times in the 170s. Symptomatic.  Marland Kitchen. Chest pain   . Abnormal stress test     Past Surgical History  Procedure Laterality Date  . Hand surgery Left     middle finger removed-table saw accident  . Cholecystectomy    . Kidney stone surgery      , Removal  . Left heart catheterization with coronary angiogram N/A 03/27/2013    Procedure: LEFT HEART CATHETERIZATION WITH CORONARY ANGIOGRAM;  Surgeon: Donato SchultzMark Dalilah Curlin, MD;  [facility];  Service: Cardiovascular;  Laterality: N/A;    Current Outpatient Prescriptions  Medication Sig Dispense Refill  . aspirin 81 MG tablet Take 81 mg by mouth daily.    Marland Kitchen. DIGOX 125 MCG tablet TAKE 1 TABLET BY MOUTH EVERY DAY 30 tablet 6  . naproxen sodium (ALEVE) 220 MG tablet Take 220 mg by mouth daily as needed.     Current Facility-Administered Medications  Medication Dose Route Frequency Provider Last Rate Last Dose  . 0.9 %  sodium chloride infusion   Intravenous Continuous Jake BatheMark C Anistyn Graddy, MD        Allergies:    Allergies  Allergen Reactions  . Flexeril [  Cyclobenzaprine] Anaphylaxis    Social History:  The patient  reports that he has never smoked. He does not have any smokeless tobacco history on file. He reports that he drinks alcohol. He reports that he does not use illicit drugs.  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 Family History  Problem Relation Age of Onset  . Diabetes Mother   . Hypertension Father   . Heart disease Paternal Grandmother   . Heart attack Paternal Grandmother   . Stroke Paternal Grandmother     ROS:  Please see the history of present illness.    Denies any fevers, chills, cough, syncope, orthopnea, PND, strokelike symptoms, bleeding, arthritic changes the   All other systems reviewed and negative.   PHYSICAL EXAM: VS:  BP 126/74 mmHg  Pulse 73  Ht  (1.753 m)  Wt 203 lb (92.08 kg)  BMI 29.96 kg/m2 Well nourished, well developed, in no acute distress HEENT: normal, [place]/AT, EOMI Neck: no JVD, normal carotid upstroke, no bruit Cardiac:  normal S1, S2; RRR; no murmur Lungs:  clear to auscultation bilaterally, no wheezing, rhonchi or rales Abd: soft, nontender, no hepatomegaly, no bruits Ext: no edema, 2+ distal pulses Skin: warm and dry GU: deferred Neuro: no focal abnormalities noted, AAO x 3  EKG: EKG was ordered today. 09/16/15-sinus rhythm, 73, no other abnormality is personally viewed-prior 09/03/14-sinus rhythm, 76, no other abnormalities. Normal sinus rhythm, no ST segment changes. Heart rate 72. No changes   Event monitor: 12/13-  brief episodes of paroxysmal atrial tachycardia, PACs. Heart rate 170 during episodes of PAT. Symptomatic.  ECHO 03/24/13 - 50% EF.  Cardiac catheterization 03/2013-normal coronary arteries, normal EF.  ASSESSMENT AND PLAN:  50 year old male with palpitations, prior event monitor demonstrating paroxysmal atrial tachycardia, atypical chest pain, anxiety  1. Chest pain - reassuring cardiac catheterization. No CAD. His chest pain may be occurring during episodes of paroxysmal atrial tachycardia, heart rate of 170 previously seen on event monitor. False positive ETT. Cardiac catheterization 2014 normal. Likely anxiety driven. Continue with low-dose aspirin. 2. Paroxysmal atrial tachycardia-did not tolerate metoprolol well do to fatigue and blood pressure of 100 systolic. We will stop Digoxin 125 mcg once a day. Has not had any another episode. Another option in the future may be calcium channel blocker. Try to avoid excessive caffeine use, decongestants. He does not have atrial  fibrillation. 3. Stress/anxiety-continue with relaxation techniques. if headache does not improve, please discuss further with Dr. Kateri Plummer. No rashes seen.  4. We will see back in 12 months. He should be able to drive without difficulty. DOT.   Signed, Donato Schultz, MD [facility]  09/16/2015 11:33 AM

## 2015-09-16 NOTE — Patient Instructions (Signed)
Medication Instructions:  Stop Digoxin.  Labwork: None   Testing/Procedures: None  Follow-Up: Your physician wants you to follow-up in: 1 year with Dr Anne FuSkains. You will receive a reminder letter in the mail two months in advance. If you don't receive a letter, please call our office to schedule the follow-up appointment.        If you need a refill on your cardiac medications before your next appointment, please call your pharmacy.

## 2015-11-21 DIAGNOSIS — L309 Dermatitis, unspecified: Secondary | ICD-10-CM | POA: Diagnosis not present

## 2016-04-29 DIAGNOSIS — Z23 Encounter for immunization: Secondary | ICD-10-CM | POA: Diagnosis not present

## 2016-04-29 DIAGNOSIS — Z0000 Encounter for general adult medical examination without abnormal findings: Secondary | ICD-10-CM | POA: Diagnosis not present

## 2016-05-05 DIAGNOSIS — Z0000 Encounter for general adult medical examination without abnormal findings: Secondary | ICD-10-CM | POA: Diagnosis not present

## 2016-05-05 DIAGNOSIS — E785 Hyperlipidemia, unspecified: Secondary | ICD-10-CM | POA: Diagnosis not present

## 2016-05-05 DIAGNOSIS — Z125 Encounter for screening for malignant neoplasm of prostate: Secondary | ICD-10-CM | POA: Diagnosis not present

## 2016-10-29 ENCOUNTER — Ambulatory Visit (INDEPENDENT_AMBULATORY_CARE_PROVIDER_SITE_OTHER): Payer: BLUE CROSS/BLUE SHIELD | Admitting: Physician Assistant

## 2016-10-29 DIAGNOSIS — W293XXA Contact with powered garden and outdoor hand tools and machinery, initial encounter: Secondary | ICD-10-CM

## 2016-10-29 DIAGNOSIS — S41112A Laceration without foreign body of left upper arm, initial encounter: Secondary | ICD-10-CM

## 2016-10-29 NOTE — Progress Notes (Signed)
   Andrew Pierce  MRN: [id number] DOB: 1966/04/21  PCP: Farris HasMorrow, Aaron, MD  Subjective:  Pt is a pleasant 51 year old male who presents to clinic for wound on left arm. He was cleaning a chain saw today. It was running, he flipped it around and it cut his arm. Bleeding was well controlled. He has not cleaned it.  UTD tdap.  Denies n/t, muscle weakness, abnormal sensation, uncontrolled bleeding.   Review of Systems  Skin: Positive for wound.  Neurological: Negative for dizziness, weakness, numbness and headaches.    Patient Active Problem List   Diagnosis Date Noted  . Chest pain 03/27/2013  . Abnormal stress test   . Palpitations 02/17/2013  . PAT (paroxysmal atrial tachycardia) (HCC) 02/17/2013  . Lower back pain 02/11/2013    Current Outpatient Prescriptions on File Prior to Visit  Medication Sig Dispense Refill  . aspirin 81 MG tablet Take 81 mg by mouth daily.    . naproxen sodium (ALEVE) 220 MG tablet Take 220 mg by mouth daily as needed.    . [DISCONTINUED] DIGOX 125 MCG tablet TAKE 1 TABLET BY MOUTH EVERY DAY 30 tablet 6   Current Facility-Administered Medications on File Prior to Visit  Medication Dose Route Frequency Provider Last Rate Last Dose  . 0.9 %  sodium chloride infusion   Intravenous Continuous Jake BatheSkains, Mark C, MD        Allergies  Allergen Reactions  . Flexeril [Cyclobenzaprine] Anaphylaxis     Objective:  There were no vitals taken for this visit.  Physical Exam  Constitutional: He is oriented to person, place, and time and well-developed, well-nourished, and in no distress. No distress.  Cardiovascular: Normal rate, regular rhythm and normal heart sounds.   Neurological: He is alert and oriented to person, place, and time. GCS score is 15.  Skin: Skin is warm and dry.     Psychiatric: Mood, memory, affect and judgment normal.  Vitals reviewed.  Procedure: Verbal consent obtained. Skin was anesthetized with 3cc lidocaine with eipnephrine and  cleaned with soap and watere. Wound was explored and no deep structures involved. Laceration was sutured with 6 sutures. Wound was dressed and wound care discussed.  Assessment and Plan :  1. Laceration of left upper extremity, initial encounter 2. Contact with chainsaw as cause of accidental injury - Laceration from chainsaw blade causing superficial laceration to left arm. 6 sutures in place. Wound care discussed. RTC in 7 days for recheck.   Marco CollieWhitney Britt Theard, PA-C  [facility] Care at [facility] 10/29/2016 1:18 PM

## 2016-11-05 ENCOUNTER — Ambulatory Visit (INDEPENDENT_AMBULATORY_CARE_PROVIDER_SITE_OTHER): Payer: BLUE CROSS/BLUE SHIELD | Admitting: Physician Assistant

## 2016-11-05 VITALS — BP 113/74 | HR 78 | Temp 98.6°F | Resp 16 | Ht 69.0 in | Wt 202.2 lb

## 2016-11-05 DIAGNOSIS — Z4802 Encounter for removal of sutures: Secondary | ICD-10-CM

## 2016-11-05 NOTE — Progress Notes (Signed)
   Andrew PootClifton C Cisek  MRN: [id number] DOB: 1965-12-06  PCP: Farris HasMorrow, Aaron, MD  Subjective:  Pt presents to clinic for suture removal. He was here one week ago after he suffered a laceration from a chainsaw to his left forearm. He is feeling well.  Denies swelling, pain, redness, drainage of pus or bleeding.   Review of Systems  Constitutional: Negative for chills and fever.  Skin: Positive for wound.  Neurological: Negative for weakness.    Patient Active Problem List   Diagnosis Date Noted  . Chest pain 03/27/2013  . Abnormal stress test   . Palpitations 02/17/2013  . PAT (paroxysmal atrial tachycardia) (HCC) 02/17/2013  . Lower back pain 02/11/2013    Current Outpatient Prescriptions on File Prior to Visit  Medication Sig Dispense Refill  . aspirin 81 MG tablet Take 81 mg by mouth daily.    . naproxen sodium (ALEVE) 220 MG tablet Take 220 mg by mouth daily as needed.    . [DISCONTINUED] DIGOX 125 MCG tablet TAKE 1 TABLET BY MOUTH EVERY DAY 30 tablet 6   Current Facility-Administered Medications on File Prior to Visit  Medication Dose Route Frequency Provider Last Rate Last Dose  . 0.9 %  sodium chloride infusion   Intravenous Continuous Jake BatheSkains, Mark C, MD        Allergies  Allergen Reactions  . Flexeril [Cyclobenzaprine] Anaphylaxis     Objective:  BP 113/74   Pulse 78   Temp 98.6 F (37 C) (Oral)   Resp 16   Ht 5\' 9"  (1.753 m)   Wt 202 lb 3.2 oz (91.7 kg)   SpO2 99%   BMI 29.86 kg/m   Physical Exam  Constitutional: He is oriented to person, place, and time and well-developed, well-nourished, and in no distress. No distress.  Cardiovascular: Normal rate, regular rhythm and normal heart sounds.   Neurological: He is alert and oriented to person, place, and time. GCS score is 15.  Skin: Skin is warm and dry.     Psychiatric: Mood, memory, affect and judgment normal.  Vitals reviewed.  Procedure: 6 sutures removed without complication left forearm. Wound  care discussed.  Assessment and Plan :  1. Encounter for removal of sutures - 6 sutures removed without complication left forearm. RTC precautions discussed.   Marco CollieWhitney Ved Martos, PA-C  [facility] at [facility] 11/05/2016 11:53 AM

## 2016-11-05 NOTE — Patient Instructions (Signed)
     IF you received an x-ray today, you will receive an invoice from [facility]. Please contact [facility] at [phone] with questions or concerns regarding your invoice.   IF you received labwork today, you will receive an invoice from [facility]. Please contact [facility] at [phone] with questions or concerns regarding your invoice.   Our billing staff will not be able to assist you with questions regarding bills from these companies.  You will be contacted with the lab results as soon as they are available. The fastest way to get your results is to activate your My Chart account. Instructions are located on the last page of this paperwork. If you have not heard from us regarding the results in 2 weeks, please contact this office.     

## 2017-05-04 DIAGNOSIS — Z23 Encounter for immunization: Secondary | ICD-10-CM | POA: Diagnosis not present

## 2017-05-04 DIAGNOSIS — Z131 Encounter for screening for diabetes mellitus: Secondary | ICD-10-CM | POA: Diagnosis not present

## 2017-05-04 DIAGNOSIS — Z125 Encounter for screening for malignant neoplasm of prostate: Secondary | ICD-10-CM | POA: Diagnosis not present

## 2017-05-04 DIAGNOSIS — Z0000 Encounter for general adult medical examination without abnormal findings: Secondary | ICD-10-CM | POA: Diagnosis not present

## 2017-05-04 DIAGNOSIS — E785 Hyperlipidemia, unspecified: Secondary | ICD-10-CM | POA: Diagnosis not present

## 2017-06-07 DIAGNOSIS — J111 Influenza due to unidentified influenza virus with other respiratory manifestations: Secondary | ICD-10-CM | POA: Diagnosis not present

## 2017-06-07 DIAGNOSIS — R509 Fever, unspecified: Secondary | ICD-10-CM | POA: Diagnosis not present

## 2017-07-08 ENCOUNTER — Telehealth: Payer: Self-pay | Admitting: Cardiology

## 2017-07-08 NOTE — Telephone Encounter (Signed)
Pt will need an appointment for evaluation.  He was due back May 2018 to see Dr Anne FuSkains.

## 2017-07-08 NOTE — Telephone Encounter (Signed)
Pt has been scheduled for an appt with Dr Anne FuSkains on 2/25 at 9:20 am.  He is aware of date and time.

## 2017-07-08 NOTE — Telephone Encounter (Signed)
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

## 2017-07-12 ENCOUNTER — Encounter: Payer: Self-pay | Admitting: *Deleted

## 2017-07-12 ENCOUNTER — Encounter: Payer: Self-pay | Admitting: Cardiology

## 2017-07-12 ENCOUNTER — Ambulatory Visit: Payer: BLUE CROSS/BLUE SHIELD | Admitting: Cardiology

## 2017-07-12 VITALS — BP 120/80 | HR 88 | Ht 69.0 in | Wt 193.0 lb

## 2017-07-12 DIAGNOSIS — I471 Supraventricular tachycardia: Secondary | ICD-10-CM | POA: Diagnosis not present

## 2017-07-12 DIAGNOSIS — R002 Palpitations: Secondary | ICD-10-CM | POA: Diagnosis not present

## 2017-07-12 NOTE — Patient Instructions (Signed)
Medication Instructions:  The current medical regimen is effective;  continue present plan and medications.  Follow-Up: Follow up as needed with Dr Skains.  Thank you for choosing [facility]!!     

## 2017-07-12 NOTE — Progress Notes (Signed)
[address] [place], [place]  [address] Phone: [phone] Fax:  [phone]  Date:  07/12/2017   ID:  Andrew PootClifton C Pierce, DOB January 24, 1966, MRN [id number]  PCP:  Farris HasMorrow, Aaron, MD MD  History of Present Illness: Andrew Pierce is a 52 y.o. male here for follow up of palpitations, lightheadedness, chest pain.   Original consult: "He recently had a friend with a myocardial infarction and he is concerned.  He underwent exercise treadmill test which was false positive, ST segment depression. Cardiac catheterization on 03/27/13 demonstrated normal arteries. Normal EF. Palpitations have started over the past year and half. 12oz of coffee in AM. Tea. No soft drinks. Been taking Claritin. No D.   His palpitations seem to be associated with anxiety either at work or at home and he has been focusing on relaxation techniques. Usually episodes last approximately 1 minute or 2. He has not had any frank syncope. Occasionally he may feel lightheaded with these episodes. In the past, he did not tolerate metoprolol. He had no energy at that time. His blood pressure was low approximately 100/60 on this medication. He stopped taking it. Chest discomfort occasionally comes about with associated left arm pain as well. Usually mild episodes.  Now with palpitations having chest pain episodes. He stops what he is doing and folds his arms and sits. Sometimes comes on with stress. Stressors at [employer]work/home.  Started baby ASA.   He is a former smoker no early family history of CAD."  09/03/14-very rarely will he feel palpitations, occasionally with anxiety symptoms. Overall he has been doing very well on his digoxin therapy. No chest pain, no syncope, no shortness of breath.  09/16/15 - Bad HA past 2 weeks. Goodies helped a little. +SOB, fatigue, CP again. Wonders if barbeque triggered it. Left sided HA. Improved. No palpitations. He did feel that his headache worsened with activity. His blood  pressure has been normal. EKG today reassuring.  07/12/17-overall doing quite well.  He has not had any further episodes of rapid heartbeat/palpitations.  He is off of all heart medications.  No chest pain fevers chills nausea vomiting syncope orthopnea.  No limitations in exercise.   Wt Readings from Last 3 Encounters:  07/12/17 193 lb (87.5 kg)  11/05/16 202 lb 3.2 oz (91.7 kg)  09/16/15 203 lb (92.1 kg)     Past Medical History:  Diagnosis Date  . Abnormal stress test   . Chest pain   . Elevated LDL cholesterol level   . Palpitations 02/17/2013   Event monitor 05/03/12-occasional tachycardia, paroxysmal atrial tachycardia, PACs heart rate at times in the 170s. Symptomatic.    Past Surgical History:  Procedure Laterality Date  . CHOLECYSTECTOMY    . HAND SURGERY Left    middle finger removed-table saw accident  . KIDNEY STONE SURGERY     , Removal  . LEFT HEART CATHETERIZATION WITH CORONARY ANGIOGRAM N/A 03/27/2013   Procedure: LEFT HEART CATHETERIZATION WITH CORONARY ANGIOGRAM;  Surgeon: Donato SchultzMark Chavonne Sforza, MD;  [facility];  Service: Cardiovascular;  Laterality: N/A;    Current Outpatient Medications  Medication Sig Dispense Refill  . aspirin 81 MG tablet Take 81 mg by mouth daily.     Current Facility-Administered Medications  Medication Dose Route Frequency Provider Last Rate Last Dose  . 0.9 %  sodium chloride infusion   Intravenous Continuous Jake BatheSkains, Daniah Zaldivar C, MD        Allergies:    Allergies  Allergen Reactions  . Flexeril [Cyclobenzaprine] Anaphylaxis    Social History:  The patient  reports that  has never smoked. he has never used smokeless tobacco. He reports that he drinks alcohol. He reports that he does not use drugs.  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 Family History  Problem Relation Age of Onset  . Diabetes Mother   . Hypertension Father   . Heart disease Paternal Grandmother   . Heart attack Paternal Grandmother   . Stroke  Paternal Grandmother     ROS:  Please see the history of present illness.   All other ROS neg  PHYSICAL EXAM: VS:  BP 120/80   Pulse 88   Ht 5\' 9"  (1.753 m)   Wt 193 lb (87.5 kg)   BMI 28.50 kg/m  GEN: Well nourished, well developed, in no acute distress  HEENT: normal  Neck: no JVD, carotid bruits, or masses Cardiac: RRR; no murmurs, rubs, or gallops,no edema  Respiratory:  clear to auscultation bilaterally, normal work of breathing GI: soft, nontender, nondistended, + BS MS: no deformity or atrophy  Skin: warm and dry, no rash Neuro:  Alert and Oriented x 3, Strength and sensation are intact Psych: euthymic mood, full affect  EKG: EKG was ordered today. 07/12/17 - normal sinus rhythm, 88 with no other abnormalities personally viewed-prior 09/16/15-sinus rhythm, 73, no other abnormality is personally viewed-prior 09/03/14-sinus rhythm, 76, no other abnormalities. Normal sinus rhythm, no ST segment changes. Heart rate 72. No changes   Event monitor: 12/13-  brief episodes of paroxysmal atrial tachycardia, PACs. Heart rate 170 during episodes of PAT. Symptomatic.  ECHO 03/24/13 - 50% EF.  Cardiac catheterization 03/2013-normal coronary arteries, normal EF.  ASSESSMENT AND PLAN:  52 year old male with palpitations, prior event monitor demonstrating paroxysmal atrial tachycardia, atypical chest pain, anxiety  1. Chest pain - Resolved, reassuring cardiac catheterization in 2014. No CAD. His chest pain may be occurring during episodes of paroxysmal atrial tachycardia, heart rate of 170 previously seen on event monitor. False positive ETT lead to cardiac catheterization 2014, normal. Likely anxiety driven. Continue with low-dose aspirin. 2. Paroxysmal atrial tachycardia-did not tolerate metoprolol well do to fatigue and blood pressure of 100 systolic. We stopped Digoxin 125 mcg once a day at a prior visit. Has not had any another episode. Another option in the future may be calcium channel  blocker if needed. Try to avoid excessive caffeine use, decongestants. He does not have atrial fibrillation. 3. Stress/anxiety-continue with relaxation techniques.  4. We will see back as needed. He should be able to drive without difficulty. DOT. Drafted a letter.    Signed, Donato Schultz, MD [facility]  07/12/2017 9:50 AM

## 2017-11-01 ENCOUNTER — Emergency Department (HOSPITAL_COMMUNITY): Payer: BLUE CROSS/BLUE SHIELD

## 2017-11-01 ENCOUNTER — Inpatient Hospital Stay (HOSPITAL_COMMUNITY)
Admission: EM | Admit: 2017-11-01 | Discharge: 2017-11-03 | DRG: 482 | Disposition: A | Discharge status: Home Health | Payer: BLUE CROSS/BLUE SHIELD | Attending: Orthopedic Surgery | Admitting: Orthopedic Surgery

## 2017-11-01 ENCOUNTER — Other Ambulatory Visit: Payer: Self-pay

## 2017-11-01 ENCOUNTER — Encounter (HOSPITAL_COMMUNITY): Payer: Self-pay | Admitting: Emergency Medicine

## 2017-11-01 DIAGNOSIS — W1842XA Slipping, tripping and stumbling without falling due to stepping into hole or opening, initial encounter: Secondary | ICD-10-CM | POA: Diagnosis not present

## 2017-11-01 DIAGNOSIS — S72332A Displaced oblique fracture of shaft of left femur, initial encounter for closed fracture: Secondary | ICD-10-CM | POA: Diagnosis not present

## 2017-11-01 DIAGNOSIS — M545 Low back pain: Secondary | ICD-10-CM | POA: Diagnosis not present

## 2017-11-01 DIAGNOSIS — S728X2A Other fracture of left femur, initial encounter for closed fracture: Secondary | ICD-10-CM | POA: Diagnosis not present

## 2017-11-01 DIAGNOSIS — Z79899 Other long term (current) drug therapy: Secondary | ICD-10-CM

## 2017-11-01 DIAGNOSIS — Z9049 Acquired absence of other specified parts of digestive tract: Secondary | ICD-10-CM | POA: Diagnosis not present

## 2017-11-01 DIAGNOSIS — Z79811 Long term (current) use of aromatase inhibitors: Secondary | ICD-10-CM | POA: Diagnosis not present

## 2017-11-01 DIAGNOSIS — D649 Anemia, unspecified: Secondary | ICD-10-CM | POA: Diagnosis present

## 2017-11-01 DIAGNOSIS — S72335A Nondisplaced oblique fracture of shaft of left femur, initial encounter for closed fracture: Secondary | ICD-10-CM | POA: Diagnosis not present

## 2017-11-01 DIAGNOSIS — I480 Paroxysmal atrial fibrillation: Secondary | ICD-10-CM | POA: Diagnosis not present

## 2017-11-01 DIAGNOSIS — Z8249 Family history of ischemic heart disease and other diseases of the circulatory system: Secondary | ICD-10-CM

## 2017-11-01 DIAGNOSIS — S3993XA Unspecified injury of pelvis, initial encounter: Secondary | ICD-10-CM | POA: Diagnosis not present

## 2017-11-01 DIAGNOSIS — R52 Pain, unspecified: Secondary | ICD-10-CM

## 2017-11-01 DIAGNOSIS — Z888 Allergy status to other drugs, medicaments and biological substances status: Secondary | ICD-10-CM | POA: Diagnosis not present

## 2017-11-01 DIAGNOSIS — S299XXA Unspecified injury of thorax, initial encounter: Secondary | ICD-10-CM | POA: Diagnosis not present

## 2017-11-01 DIAGNOSIS — S72352A Displaced comminuted fracture of shaft of left femur, initial encounter for closed fracture: Secondary | ICD-10-CM | POA: Diagnosis not present

## 2017-11-01 DIAGNOSIS — R079 Chest pain, unspecified: Secondary | ICD-10-CM | POA: Diagnosis not present

## 2017-11-01 DIAGNOSIS — R002 Palpitations: Secondary | ICD-10-CM | POA: Diagnosis not present

## 2017-11-01 DIAGNOSIS — S72332D Displaced oblique fracture of shaft of left femur, subsequent encounter for closed fracture with routine healing: Secondary | ICD-10-CM | POA: Diagnosis not present

## 2017-11-01 DIAGNOSIS — R9439 Abnormal result of other cardiovascular function study: Secondary | ICD-10-CM | POA: Diagnosis not present

## 2017-11-01 DIAGNOSIS — S82392A Other fracture of lower end of left tibia, initial encounter for closed fracture: Secondary | ICD-10-CM | POA: Diagnosis not present

## 2017-11-01 DIAGNOSIS — T148XXA Other injury of unspecified body region, initial encounter: Secondary | ICD-10-CM

## 2017-11-01 DIAGNOSIS — Z419 Encounter for procedure for purposes other than remedying health state, unspecified: Secondary | ICD-10-CM

## 2017-11-01 DIAGNOSIS — M79605 Pain in left leg: Secondary | ICD-10-CM | POA: Diagnosis not present

## 2017-11-01 DIAGNOSIS — I10 Essential (primary) hypertension: Secondary | ICD-10-CM | POA: Diagnosis not present

## 2017-11-01 LAB — CBC WITH DIFFERENTIAL/PLATELET
Abs Immature Granulocytes: 0.1 10*3/uL (ref 0.0–0.1)
Basophils Absolute: 0 10*3/uL (ref 0.0–0.1)
Basophils Relative: 1 %
EOS ABS: 0.2 10*3/uL (ref 0.0–0.7)
EOS PCT: 2 %
HCT: 36.1 % — ABNORMAL LOW (ref 39.0–52.0)
HEMOGLOBIN: 12.7 g/dL — AB (ref 13.0–17.0)
Immature Granulocytes: 1 %
Lymphocytes Relative: 21 %
Lymphs Abs: 1.6 10*3/uL (ref 0.7–4.0)
MCH: 32.4 pg (ref 26.0–34.0)
MCHC: 35.2 g/dL (ref 30.0–36.0)
MCV: 92.1 fL (ref 78.0–100.0)
Monocytes Absolute: 0.6 10*3/uL (ref 0.1–1.0)
Monocytes Relative: 8 %
Neutro Abs: 5.1 10*3/uL (ref 1.7–7.7)
Neutrophils Relative %: 67 %
Platelets: 219 10*3/uL (ref 150–400)
RBC: 3.92 MIL/uL — AB (ref 4.22–5.81)
RDW: 12.9 % (ref 11.5–15.5)
WBC: 7.6 10*3/uL (ref 4.0–10.5)

## 2017-11-01 LAB — BASIC METABOLIC PANEL
Anion gap: 6 (ref 5–15)
BUN: 19 mg/dL (ref 6–20)
CHLORIDE: 107 mmol/L (ref 101–111)
CO2: 28 mmol/L (ref 22–32)
Calcium: 8.9 mg/dL (ref 8.9–10.3)
Creatinine, Ser: 1.06 mg/dL (ref 0.61–1.24)
GFR calc Af Amer: >60 mL/min (ref >60)
GFR calc non Af Amer: >60 mL/min (ref >60)
Glucose, Bld: 121 mg/dL — ABNORMAL HIGH (ref 65–99)
Potassium: 4.3 mmol/L (ref 3.5–5.1)
SODIUM: 141 mmol/L (ref 135–145)

## 2017-11-01 LAB — PROTIME-INR
INR: 1
Prothrombin Time: 13.1 seconds (ref 11.4–15.2)

## 2017-11-01 MED ORDER — HYDROMORPHONE HCL 2 MG/ML IJ SOLN
1.0000 mg | Freq: Once | INTRAMUSCULAR | Status: AC
  Administered 2017-11-01: 1 mg via INTRAVENOUS
  Filled 2017-11-01: qty 1

## 2017-11-01 MED ORDER — MORPHINE SULFATE (PF) 4 MG/ML IV SOLN
4.0000 mg | INTRAVENOUS | Status: DC | PRN
  Administered 2017-11-01 – 2017-11-02 (×2): 4 mg via INTRAVENOUS
  Filled 2017-11-01 (×2): qty 1

## 2017-11-01 MED ORDER — OXYCODONE-ACETAMINOPHEN 5-325 MG PO TABS
1.0000 | ORAL_TABLET | Freq: Four times a day (QID) | ORAL | Status: DC | PRN
  Administered 2017-11-01 – 2017-11-02 (×2): 2 via ORAL
  Filled 2017-11-01 (×2): qty 2

## 2017-11-01 NOTE — ED Triage Notes (Signed)
Pt arriving via [facility] after he fell in a hole doing yard walk. Pain, tenderness and deformity noticed about 4 inches above his left knee. Given 300 mcg of Fentanyl by EMS.  No LOC. VS 150/72, HR 94, 99% RA.

## 2017-11-01 NOTE — ED Notes (Signed)
Report to charge RN on 5N. Pt en route w/ tech Iantha FallenKenneth

## 2017-11-01 NOTE — ED Provider Notes (Signed)
[facility] EMERGENCY DEPARTMENT Provider Note   CSN: [id number] Arrival date & time:        History   Chief Complaint Chief Complaint  Patient presents with  . Fracture    HPI Andrew Pierce is a 52 y.o. male.  The history is provided by the patient.  Leg Pain   This is a new problem. The current episode started 1 to 2 hours ago (sudden onset after fall when stepping into a hole in the yard). The problem occurs constantly. The problem has not changed since onset.The pain is present in the left knee and left upper leg. The quality of the pain is described as sharp. The pain is severe. Associated symptoms include limited range of motion. Pertinent negatives include no numbness and no tingling. Exacerbated by: ROM of knee. Treatments tried: fentanyl per EMS. The treatment provided moderate relief. There has been no history of extremity trauma.    Past Medical History:  Diagnosis Date  . Abnormal stress test   . Chest pain   . Elevated LDL cholesterol level   . Palpitations 02/17/2013   Event monitor 05/03/12-occasional tachycardia, paroxysmal atrial tachycardia, PACs heart rate at times in the 170s. Symptomatic.    Patient Active Problem List   Diagnosis Date Noted  . Other fracture of left femur, initial encounter for closed fracture (HCC) 11/01/2017  . Chest pain 03/27/2013  . Abnormal stress test   . Palpitations 02/17/2013  . PAT (paroxysmal atrial tachycardia) (HCC) 02/17/2013  . Lower back pain 02/11/2013    Past Surgical History:  Procedure Laterality Date  . CHOLECYSTECTOMY    . HAND SURGERY Left    middle finger removed-table saw accident  . KIDNEY STONE SURGERY     , Removal  . LEFT HEART CATHETERIZATION WITH CORONARY ANGIOGRAM N/A 03/27/2013   Procedure: LEFT HEART CATHETERIZATION WITH CORONARY ANGIOGRAM;  Surgeon: Donato Schultz, MD;  Location: [facility] CATH LAB;  Service: Cardiovascular;  Laterality: N/A;        Home Medications     Prior to Admission medications   Medication Sig Start Date End Date Taking? Authorizing Provider  naproxen sodium (ALEVE) 220 MG tablet Take 220-440 mg by mouth 2 (two) times daily as needed (for pain).    Yes [provider]  DIGOX 125 MCG tablet TAKE 1 TABLET BY MOUTH EVERY DAY 03/06/15 09/16/15  Jake Bathe, MD    Family History Family History  Problem Relation Age of Onset  . Diabetes Mother   . Hypertension Father   . Heart disease Paternal Grandmother   . Heart attack Paternal Grandmother   . Stroke Paternal Grandmother     Social History Social History   Tobacco Use  . Smoking status: Never Smoker  . Smokeless tobacco: Never Used  Substance Use Topics  . Alcohol use: Yes  . Drug use: No     Allergies   Flexeril [cyclobenzaprine]   Review of Systems Review of Systems  Constitutional: Negative for activity change and fatigue.  HENT: Negative for dental problem and nosebleeds.   Eyes: Negative for visual disturbance.  Respiratory: Negative for chest tightness and shortness of breath.   Cardiovascular: Negative for chest pain.  Gastrointestinal: Negative for abdominal pain, nausea and vomiting.  Genitourinary: Negative for flank pain and penile pain.  Musculoskeletal: Positive for arthralgias, gait problem and myalgias.  Neurological: Negative for tingling, weakness and numbness.  Psychiatric/Behavioral: Negative for confusion.     Physical Exam Updated Vital Signs BP Marland Kitchen)  149/73   Pulse 88   Temp 98 F (36.7 C) (Oral)   Resp 18   Ht 5\' 9"  (1.753 m)   Wt 87.5 kg (193 lb)   SpO2 97%   BMI 28.50 kg/m   Physical Exam  Constitutional: He is oriented to person, place, and time. He appears well-developed and well-nourished.  HENT:  Head: Normocephalic and atraumatic.  Mouth/Throat: Oropharynx is clear and moist.  Eyes: Conjunctivae and EOM are normal.  Neck: Normal range of motion. Neck supple.  Cardiovascular: Normal rate, regular rhythm,  normal heart sounds and intact distal pulses.  No murmur heard. Pulmonary/Chest: Effort normal and breath sounds normal. No stridor. No respiratory distress.  Abdominal: Soft. There is no tenderness.  Musculoskeletal: He exhibits tenderness and deformity. He exhibits no edema.  LLE: swelling over anterior thigh with tenderness to distal thigh, diffusely about knee, limited ROM of knee, NVI distally   Neurological: He is alert and oriented to person, place, and time. No sensory deficit.  Skin: Skin is warm and dry.  Psychiatric: He has a normal mood and affect.  Nursing note and vitals reviewed.    ED Treatments / Results  Labs (all labs ordered are listed, but only abnormal results are displayed) Labs Reviewed  CBC WITH DIFFERENTIAL/PLATELET - Abnormal; Notable for the following components:      Result Value   RBC 3.92 (*)    Hemoglobin 12.7 (*)    HCT 36.1 (*)    All other components within normal limits  BASIC METABOLIC PANEL - Abnormal; Notable for the following components:   Glucose, Bld 121 (*)    All other components within normal limits  PROTIME-INR    EKG None  Radiology Dg Chest 1 View  Result Date: 11/01/2017 CLINICAL DATA:  Initial evaluation for acute trauma, fall. EXAM: CHEST  1 VIEW COMPARISON:  None. FINDINGS: Patient is slightly rotated to the right. Cardiac and mediastinal silhouettes within normal limits. The lungs are normally inflated. No airspace consolidation, pleural effusion, or pulmonary edema is identified. There is no pneumothorax. No acute osseous abnormality identified. IMPRESSION: No active cardiopulmonary disease. Electronically Signed   By: Rise MuBenjamin  McClintock M.D.   On: 11/01/2017 20:39   Dg Pelvis 1-2 Views  Result Date: 11/01/2017 CLINICAL DATA:  Initial evaluation for acute trauma, fall. EXAM: PELVIS - 1-2 VIEW COMPARISON:  None. FINDINGS: No acute fracture or dislocation. Femoral heads in normal alignment within the acetabula. Bony pelvis  intact. SI joints approximated. No pubic diastasis. No soft tissue abnormality. IMPRESSION: No acute osseous abnormality about the pelvis. Electronically Signed   By: Rise MuBenjamin  McClintock M.D.   On: 11/01/2017 20:33   Dg Knee 1-2 Views Left  Result Date: 11/01/2017 CLINICAL DATA:  Initial evaluation for acute trauma, fall. EXAM: LEFT KNEE - 1-2 VIEW COMPARISON:  None. FINDINGS: Acute comminuted and mildly displaced oblique fracture of the distal left femoral shaft. No other acute fracture or dislocation. No joint effusion. No acute soft tissue abnormality. IMPRESSION: 1. Acute comminuted oblique fracture of the distal left femoral shaft. Electronically Signed   By: Rise MuBenjamin  McClintock M.D.   On: 11/01/2017 20:36   Dg Tibia/fibula Left  Result Date: 11/01/2017 CLINICAL DATA:  Initial evaluation for acute trauma, fall. EXAM: LEFT TIBIA AND FIBULA - 2 VIEW COMPARISON:  None. FINDINGS: No acute fracture or dislocation about the left tibia/fibula. Acute fracture involving the distal left femoral shaft partially visualized. No acute soft tissue abnormality. IMPRESSION: 1. No acute osseous abnormality about the  left tibia/fibula. 2. Acute fracture involving the distal left fibular shaft, partially visualized. Electronically Signed   By: Rise Mu M.D.   On: 11/01/2017 20:34   Dg Femur Min 2 Views Left  Result Date: 11/01/2017 CLINICAL DATA:  Initial evaluation for acute trauma, fall. EXAM: LEFT FEMUR 2 VIEWS COMPARISON:  None. FINDINGS: There is an acute predominantly oblique and slightly comminuted fracture of the distal left femoral shaft. Left hip and knee remain approximated. No acute soft tissue abnormality. 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 IMPRESSION: Acute comminuted predominantly oblique fracture of the distal left femoral shaft. Electronically Signed   By: Rise Mu M.D.   On: 11/01/2017 20:37    Procedures Procedures (including critical care time)  Medications  Ordered in ED Medications  oxyCODONE-acetaminophen (PERCOCET/ROXICET) 5-325 MG per tablet 1-2 tablet (has no administration in time range)  morphine 4 MG/ML injection 4 mg (has no administration in time range)  HYDROmorphone (DILAUDID) injection 1 mg (1 mg Intravenous Given 11/01/17 2030)     Initial Impression / Assessment and Plan / ED Course  I have reviewed the triage vital signs and the nursing notes.  Pertinent labs & imaging results that were available during my care of the patient were reviewed by me and considered in my medical decision making (see chart for details).     Andrew Pierce is a 52 y.o. male with PMHx of paroxysmal atrial fibrillation (no longer on digoxin) who p/w sudden onset left leg pain after fall. Reviewed and confirmed nursing documentation for past medical history, family history, social history. VS afebrile, wnl. Exam remarkable for LLE tenderness, swelling to thigh, NVI distally. Concern for fracture.   IV dilaudid given. CBC with normocytic anemia, hgb 12.7, otherwise unremarkable. BMP wnl. INR wnl. xrays of LLE with acute comminuted oblique fracture to distal left femoral shaft. Discussed with Dr.Murphy, Orthopedics, who will admit, NPO at [facility] for surgery tomorrow.   Old records reviewed. Labs reviewed by me and used in the medical decision making.  Imaging viewed and interpreted by me and used in the medical decision making (formal interpretation from radiologist). Admitted to orthopedics.    Final Clinical Impressions(s) / ED Diagnoses   Final diagnoses:  Closed nondisplaced oblique fracture of shaft of left femur, initial encounter [facility])    ED Discharge Orders    None       Diannia Ruder, MD 11/01/17 1610    Cathren Laine, MD 11/01/17 2149

## 2017-11-01 NOTE — ED Notes (Signed)
Attempted report 

## 2017-11-02 ENCOUNTER — Inpatient Hospital Stay (HOSPITAL_COMMUNITY): Payer: BLUE CROSS/BLUE SHIELD

## 2017-11-02 ENCOUNTER — Inpatient Hospital Stay (HOSPITAL_COMMUNITY): Payer: BLUE CROSS/BLUE SHIELD | Admitting: Anesthesiology

## 2017-11-02 ENCOUNTER — Encounter (HOSPITAL_COMMUNITY): Payer: Self-pay | Admitting: Certified Registered Nurse Anesthetist

## 2017-11-02 ENCOUNTER — Encounter (HOSPITAL_COMMUNITY)
Admission: EM | Disposition: A | Discharge status: Home Health | Payer: Self-pay | Source: Home / Self Care | Attending: Orthopedic Surgery

## 2017-11-02 HISTORY — PX: FEMUR IM NAIL: SHX1597

## 2017-11-02 LAB — CREATININE, SERUM
CREATININE: 0.86 mg/dL (ref 0.61–1.24)
GFR calc non Af Amer: >60 mL/min (ref >60)

## 2017-11-02 LAB — SURGICAL PCR SCREEN
MRSA, PCR: NEGATIVE
Staphylococcus aureus: POSITIVE — AB

## 2017-11-02 LAB — CBC
HCT: 34.8 % — ABNORMAL LOW (ref 39.0–52.0)
Hemoglobin: 12.3 g/dL — ABNORMAL LOW (ref 13.0–17.0)
MCH: 32.6 pg (ref 26.0–34.0)
MCHC: 35.3 g/dL (ref 30.0–36.0)
MCV: 92.3 fL (ref 78.0–100.0)
PLATELETS: 221 10*3/uL (ref 150–400)
RBC: 3.77 MIL/uL — AB (ref 4.22–5.81)
RDW: 13.1 % (ref 11.5–15.5)
WBC: 13.9 10*3/uL — AB (ref 4.0–10.5)

## 2017-11-02 SURGERY — INSERTION, INTRAMEDULLARY ROD, FEMUR, RETROGRADE
Anesthesia: General | Site: Leg Upper | Laterality: Left

## 2017-11-02 MED ORDER — DIPHENHYDRAMINE HCL 12.5 MG/5ML PO ELIX: 12.5000 mg | ORAL_SOLUTION | ORAL | Status: DC | PRN

## 2017-11-02 MED ORDER — GABAPENTIN 300 MG PO CAPS
300.0000 mg | ORAL_CAPSULE | Freq: Three times a day (TID) | ORAL | Status: DC
  Administered 2017-11-02 – 2017-11-03 (×3): 300 mg via ORAL
  Filled 2017-11-02 (×3): qty 1

## 2017-11-02 MED ORDER — 0.9 % SODIUM CHLORIDE (POUR BTL) OPTIME
TOPICAL | Status: DC | PRN
  Administered 2017-11-02: 1000 mL

## 2017-11-02 MED ORDER — METHOCARBAMOL 1000 MG/10ML IJ SOLN: 500.0000 mg | Freq: Four times a day (QID) | INTRAMUSCULAR | Status: DC | PRN

## 2017-11-02 MED ORDER — FENTANYL CITRATE (PF) 100 MCG/2ML IJ SOLN
INTRAMUSCULAR | Status: DC | PRN
  Administered 2017-11-02: 125 ug via INTRAVENOUS
  Administered 2017-11-02: 50 ug via INTRAVENOUS
  Administered 2017-11-02: 25 ug via INTRAVENOUS
  Administered 2017-11-02: 50 ug via INTRAVENOUS

## 2017-11-02 MED ORDER — LACTATED RINGERS IV SOLN: INTRAVENOUS | Status: DC

## 2017-11-02 MED ORDER — BUPIVACAINE-EPINEPHRINE (PF) 0.5% -1:200000 IJ SOLN
INTRAMUSCULAR | Status: AC
  Filled 2017-11-02: qty 30

## 2017-11-02 MED ORDER — CEFAZOLIN SODIUM-DEXTROSE 2-4 GM/100ML-% IV SOLN
INTRAVENOUS | Status: AC
  Filled 2017-11-02: qty 100

## 2017-11-02 MED ORDER — ENOXAPARIN SODIUM 40 MG/0.4ML ~~LOC~~ SOLN: 40.0000 mg | SUBCUTANEOUS | Status: DC

## 2017-11-02 MED ORDER — MEPERIDINE HCL 50 MG/ML IJ SOLN: 6.2500 mg | INTRAMUSCULAR | Status: DC | PRN

## 2017-11-02 MED ORDER — ASPIRIN EC 81 MG PO TBEC: 81.0000 mg | DELAYED_RELEASE_TABLET | Freq: Two times a day (BID) | ORAL | 0 refills | Status: AC

## 2017-11-02 MED ORDER — ACETAMINOPHEN 500 MG PO TABS
1000.0000 mg | ORAL_TABLET | Freq: Three times a day (TID) | ORAL | Status: DC
  Administered 2017-11-02 – 2017-11-03 (×3): 1000 mg via ORAL
  Filled 2017-11-02 (×3): qty 2

## 2017-11-02 MED ORDER — METOCLOPRAMIDE HCL 5 MG PO TABS: 5.0000 mg | ORAL_TABLET | Freq: Three times a day (TID) | ORAL | Status: DC | PRN

## 2017-11-02 MED ORDER — OMEPRAZOLE 20 MG PO CPDR: 20.0000 mg | DELAYED_RELEASE_CAPSULE | Freq: Every day | ORAL | 0 refills | Status: AC

## 2017-11-02 MED ORDER — FENTANYL CITRATE (PF) 250 MCG/5ML IJ SOLN
INTRAMUSCULAR | Status: AC
  Filled 2017-11-02: qty 5

## 2017-11-02 MED ORDER — LACTATED RINGERS IV SOLN
INTRAVENOUS | Status: DC
  Administered 2017-11-02 (×2): via INTRAVENOUS

## 2017-11-02 MED ORDER — SUGAMMADEX SODIUM 200 MG/2ML IV SOLN
INTRAVENOUS | Status: AC
  Filled 2017-11-02: qty 2

## 2017-11-02 MED ORDER — ONDANSETRON HCL 4 MG/2ML IJ SOLN
INTRAMUSCULAR | Status: DC | PRN
  Administered 2017-11-02: 4 mg via INTRAVENOUS

## 2017-11-02 MED ORDER — OXYCODONE HCL 5 MG PO TABS
ORAL_TABLET | ORAL | Status: AC
  Filled 2017-11-02: qty 1

## 2017-11-02 MED ORDER — ACETAMINOPHEN 500 MG PO TABS: 1000.0000 mg | ORAL_TABLET | Freq: Three times a day (TID) | ORAL | 0 refills | Status: AC

## 2017-11-02 MED ORDER — METHOCARBAMOL 500 MG PO TABS
500.0000 mg | ORAL_TABLET | Freq: Four times a day (QID) | ORAL | Status: DC | PRN
Start: 2017-11-02 — End: 2017-11-03
  Administered 2017-11-02 – 2017-11-03 (×2): 500 mg via ORAL
  Filled 2017-11-02 (×2): qty 1

## 2017-11-02 MED ORDER — LIDOCAINE 2% (20 MG/ML) 5 ML SYRINGE
INTRAMUSCULAR | Status: DC | PRN
  Administered 2017-11-02: 80 mg via INTRAVENOUS

## 2017-11-02 MED ORDER — ROCURONIUM BROMIDE 50 MG/5ML IV SOLN
INTRAVENOUS | Status: AC
  Filled 2017-11-02: qty 1

## 2017-11-02 MED ORDER — ACETAMINOPHEN 500 MG PO TABS: 1000.0000 mg | ORAL_TABLET | Freq: Four times a day (QID) | ORAL | Status: DC

## 2017-11-02 MED ORDER — ACETAMINOPHEN 325 MG PO TABS
325.0000 mg | ORAL_TABLET | Freq: Four times a day (QID) | ORAL | Status: DC | PRN
Start: 2017-11-03 — End: 2017-11-03

## 2017-11-02 MED ORDER — ENOXAPARIN SODIUM 40 MG/0.4ML ~~LOC~~ SOLN
40.0000 mg | SUBCUTANEOUS | Status: DC
  Administered 2017-11-02: 40 mg via SUBCUTANEOUS
  Filled 2017-11-02: qty 0.4

## 2017-11-02 MED ORDER — KETOROLAC TROMETHAMINE 15 MG/ML IJ SOLN
15.0000 mg | Freq: Four times a day (QID) | INTRAMUSCULAR | Status: AC
  Administered 2017-11-02 (×3): 15 mg via INTRAVENOUS
  Filled 2017-11-02 (×3): qty 1

## 2017-11-02 MED ORDER — ACETAMINOPHEN 650 MG RE SUPP: 650.0000 mg | Freq: Four times a day (QID) | RECTAL | Status: DC | PRN

## 2017-11-02 MED ORDER — METHOCARBAMOL 1000 MG/10ML IJ SOLN
500.0000 mg | Freq: Four times a day (QID) | INTRAVENOUS | Status: DC | PRN
  Filled 2017-11-02: qty 5

## 2017-11-02 MED ORDER — DOCUSATE SODIUM 100 MG PO CAPS: 100.0000 mg | ORAL_CAPSULE | Freq: Two times a day (BID) | ORAL | Status: DC

## 2017-11-02 MED ORDER — POLYETHYLENE GLYCOL 3350 17 G PO PACK: 17.0000 g | PACK | Freq: Every day | ORAL | Status: DC | PRN

## 2017-11-02 MED ORDER — MIDAZOLAM HCL 5 MG/5ML IJ SOLN
INTRAMUSCULAR | Status: DC | PRN
  Administered 2017-11-02: 2 mg via INTRAVENOUS

## 2017-11-02 MED ORDER — METHOCARBAMOL 500 MG PO TABS: 500.0000 mg | ORAL_TABLET | Freq: Four times a day (QID) | ORAL | 0 refills | Status: DC | PRN

## 2017-11-02 MED ORDER — CELECOXIB 200 MG PO CAPS: 200.0000 mg | ORAL_CAPSULE | Freq: Two times a day (BID) | ORAL | 0 refills | Status: AC

## 2017-11-02 MED ORDER — PROPOFOL 10 MG/ML IV BOLUS
INTRAVENOUS | Status: DC | PRN
  Administered 2017-11-02 (×2): 20 mg via INTRAVENOUS
  Administered 2017-11-02: 160 mg via INTRAVENOUS

## 2017-11-02 MED ORDER — CEFAZOLIN SODIUM-DEXTROSE 2-4 GM/100ML-% IV SOLN
2.0000 g | Freq: Four times a day (QID) | INTRAVENOUS | Status: AC
  Administered 2017-11-02 (×3): 2 g via INTRAVENOUS
  Filled 2017-11-02 (×4): qty 100

## 2017-11-02 MED ORDER — LIDOCAINE HCL (CARDIAC) PF 100 MG/5ML IV SOSY: PREFILLED_SYRINGE | INTRAVENOUS | Status: DC | PRN

## 2017-11-02 MED ORDER — DOCUSATE SODIUM 100 MG PO CAPS
100.0000 mg | ORAL_CAPSULE | Freq: Two times a day (BID) | ORAL | Status: DC
  Administered 2017-11-02 – 2017-11-03 (×3): 100 mg via ORAL
  Filled 2017-11-02 (×3): qty 1

## 2017-11-02 MED ORDER — MIDAZOLAM HCL 2 MG/2ML IJ SOLN
INTRAMUSCULAR | Status: AC
  Filled 2017-11-02: qty 2

## 2017-11-02 MED ORDER — OXYCODONE HCL 5 MG PO TABS
5.0000 mg | ORAL_TABLET | Freq: Once | ORAL | Status: AC | PRN
  Administered 2017-11-02: 5 mg via ORAL

## 2017-11-02 MED ORDER — OXYCODONE HCL 5 MG PO TABS
5.0000 mg | ORAL_TABLET | ORAL | Status: DC | PRN
  Administered 2017-11-02 – 2017-11-03 (×3): 10 mg via ORAL
  Filled 2017-11-02 (×3): qty 2

## 2017-11-02 MED ORDER — METHOCARBAMOL 500 MG PO TABS: 500.0000 mg | ORAL_TABLET | Freq: Four times a day (QID) | ORAL | Status: DC | PRN

## 2017-11-02 MED ORDER — OXYCODONE HCL 5 MG PO TABS: 5.0000 mg | ORAL_TABLET | ORAL | 0 refills | Status: AC | PRN

## 2017-11-02 MED ORDER — PROPOFOL 10 MG/ML IV BOLUS
INTRAVENOUS | Status: AC
  Filled 2017-11-02: qty 20

## 2017-11-02 MED ORDER — BUPIVACAINE-EPINEPHRINE 0.5% -1:200000 IJ SOLN
INTRAMUSCULAR | Status: DC | PRN
  Administered 2017-11-02: 30 mL

## 2017-11-02 MED ORDER — METHOCARBAMOL 500 MG PO TABS
ORAL_TABLET | ORAL | Status: AC
  Filled 2017-11-02: qty 1

## 2017-11-02 MED ORDER — ACETAMINOPHEN 325 MG PO TABS: 650.0000 mg | ORAL_TABLET | Freq: Four times a day (QID) | ORAL | Status: DC | PRN

## 2017-11-02 MED ORDER — ONDANSETRON HCL 4 MG PO TABS
4.0000 mg | ORAL_TABLET | Freq: Four times a day (QID) | ORAL | Status: DC | PRN
Start: 2017-11-02 — End: 2017-11-03

## 2017-11-02 MED ORDER — HYDROMORPHONE HCL 2 MG/ML IJ SOLN: 0.2500 mg | INTRAMUSCULAR | Status: DC | PRN

## 2017-11-02 MED ORDER — METOCLOPRAMIDE HCL 5 MG/ML IJ SOLN: 5.0000 mg | Freq: Three times a day (TID) | INTRAMUSCULAR | Status: DC | PRN

## 2017-11-02 MED ORDER — DOCUSATE SODIUM 100 MG PO CAPS: 100.0000 mg | ORAL_CAPSULE | Freq: Two times a day (BID) | ORAL | 0 refills | Status: DC

## 2017-11-02 MED ORDER — SUGAMMADEX SODIUM 200 MG/2ML IV SOLN
INTRAVENOUS | Status: DC | PRN
  Administered 2017-11-02: 175 mg via INTRAVENOUS

## 2017-11-02 MED ORDER — ONDANSETRON HCL 4 MG/2ML IJ SOLN
4.0000 mg | Freq: Four times a day (QID) | INTRAMUSCULAR | Status: DC | PRN
  Filled 2017-11-02: qty 2

## 2017-11-02 MED ORDER — OXYCODONE HCL 5 MG/5ML PO SOLN: 5.0000 mg | Freq: Once | ORAL | Status: AC | PRN

## 2017-11-02 MED ORDER — ONDANSETRON HCL 4 MG/2ML IJ SOLN
INTRAMUSCULAR | Status: AC
  Filled 2017-11-02: qty 2

## 2017-11-02 MED ORDER — ONDANSETRON HCL 4 MG PO TABS: 4.0000 mg | ORAL_TABLET | Freq: Three times a day (TID) | ORAL | 0 refills | Status: DC | PRN

## 2017-11-02 MED ORDER — PROMETHAZINE HCL 25 MG/ML IJ SOLN: 6.2500 mg | INTRAMUSCULAR | Status: DC | PRN

## 2017-11-02 MED ORDER — ROCURONIUM BROMIDE 100 MG/10ML IV SOLN
INTRAVENOUS | Status: DC | PRN
  Administered 2017-11-02: 50 mg via INTRAVENOUS

## 2017-11-02 MED ORDER — LIDOCAINE 2% (20 MG/ML) 5 ML SYRINGE
INTRAMUSCULAR | Status: AC
  Filled 2017-11-02: qty 5

## 2017-11-02 MED ORDER — KETOROLAC TROMETHAMINE 15 MG/ML IJ SOLN: 15.0000 mg | Freq: Four times a day (QID) | INTRAMUSCULAR | Status: DC

## 2017-11-02 SURGICAL SUPPLY — 63 items
BANDAGE ACE 4X5 VEL STRL LF (GAUZE/BANDAGES/DRESSINGS) ×2 IMPLANT
BANDAGE ACE 6X5 VEL STRL LF (GAUZE/BANDAGES/DRESSINGS) ×2 IMPLANT
BANDAGE ESMARK 6X9 LF (GAUZE/BANDAGES/DRESSINGS) IMPLANT
BIT DRILL AO GAMMA 4.2X180 (BIT) ×2 IMPLANT
BIT DRILL AO GAMMA 4.2X340 (BIT) ×2 IMPLANT
BLADE CLIPPER SURG (BLADE) IMPLANT
BNDG COHESIVE 6X5 TAN STRL LF (GAUZE/BANDAGES/DRESSINGS) ×2 IMPLANT
BNDG ESMARK 6X9 LF (GAUZE/BANDAGES/DRESSINGS)
COVER SURGICAL LIGHT HANDLE (MISCELLANEOUS) ×4 IMPLANT
DRAPE C-ARM 42X72 X-RAY (DRAPES) ×2 IMPLANT
DRAPE C-ARMOR (DRAPES) ×2 IMPLANT
DRAPE HALF SHEET 40X57 (DRAPES) ×4 IMPLANT
DRAPE IMP U-DRAPE 54X76 (DRAPES) ×2 IMPLANT
DRAPE ORTHO SPLIT 77X108 STRL (DRAPES) ×2
DRAPE SURG ORHT 6 SPLT 77X108 (DRAPES) ×2 IMPLANT
DRAPE U-SHAPE 47X51 STRL (DRAPES) ×2 IMPLANT
DRSG ADAPTIC 3X8 NADH LF (GAUZE/BANDAGES/DRESSINGS) ×2 IMPLANT
DRSG MEPILEX BORDER 4X4 (GAUZE/BANDAGES/DRESSINGS) ×2 IMPLANT
DRSG MEPILEX BORDER 4X8 (GAUZE/BANDAGES/DRESSINGS) ×2 IMPLANT
DRSG PAD ABDOMINAL 8X10 ST (GAUZE/BANDAGES/DRESSINGS) ×6 IMPLANT
DURAPREP 26ML APPLICATOR (WOUND CARE) ×2 IMPLANT
ELECT REM PT RETURN 9FT ADLT (ELECTROSURGICAL) ×2
ELECTRODE REM PT RTRN 9FT ADLT (ELECTROSURGICAL) ×1 IMPLANT
GAUZE SPONGE 4X4 12PLY STRL (GAUZE/BANDAGES/DRESSINGS) ×2 IMPLANT
GLOVE BIO SURGEON STRL SZ7.5 (GLOVE) ×4 IMPLANT
GLOVE BIOGEL PI IND STRL 8 (GLOVE) ×2 IMPLANT
GLOVE BIOGEL PI INDICATOR 8 (GLOVE) ×2
GOWN STRL REUS W/ TWL LRG LVL3 (GOWN DISPOSABLE) ×2 IMPLANT
GOWN STRL REUS W/ TWL XL LVL3 (GOWN DISPOSABLE) ×1 IMPLANT
GOWN STRL REUS W/TWL LRG LVL3 (GOWN DISPOSABLE) ×2
GOWN STRL REUS W/TWL XL LVL3 (GOWN DISPOSABLE) ×1
GUIDEROD T2 3X1000 (ROD) ×2 IMPLANT
GUIDEWIRE GAMMA (WIRE) ×2 IMPLANT
KIT BASIN OR (CUSTOM PROCEDURE TRAY) ×2 IMPLANT
KIT TURNOVER KIT B (KITS) ×2 IMPLANT
MANIFOLD NEPTUNE II (INSTRUMENTS) ×2 IMPLANT
NAIL LOCK CANN 11X400 MH (Nail) ×2 IMPLANT
NEEDLE 22X1 1/2 (OR ONLY) (NEEDLE) ×2 IMPLANT
NS IRRIG 1000ML POUR BTL (IV SOLUTION) ×2 IMPLANT
PACK GENERAL/GYN (CUSTOM PROCEDURE TRAY) ×2 IMPLANT
PACK UNIVERSAL I (CUSTOM PROCEDURE TRAY) ×2 IMPLANT
PAD ARMBOARD 7.5X6 YLW CONV (MISCELLANEOUS) ×4 IMPLANT
PAD CAST 4YDX4 CTTN HI CHSV (CAST SUPPLIES) ×1 IMPLANT
PADDING CAST COTTON 4X4 STRL (CAST SUPPLIES) ×1
PADDING CAST COTTON 6X4 STRL (CAST SUPPLIES) ×2 IMPLANT
REAMER SHAFT BIXCUT (INSTRUMENTS) ×2 IMPLANT
SCREW LOCK 5X80 FT T2 (Screw) ×2 IMPLANT
SCREW LOCKING T2 F/T  5MMX40MM (Screw) ×1 IMPLANT
SCREW LOCKING T2 F/T  5MMX65MM (Screw) ×1 IMPLANT
SCREW LOCKING T2 F/T 5MMX40MM (Screw) ×1 IMPLANT
SCREW LOCKING T2 F/T 5MMX65MM (Screw) ×1 IMPLANT
STOCKINETTE IMPERVIOUS LG (DRAPES) ×2 IMPLANT
SUT ETHILON 3 0 PS 1 (SUTURE) ×4 IMPLANT
SUT MNCRL AB 4-0 PS2 18 (SUTURE) ×2 IMPLANT
SUT MON AB 2-0 CT1 27 (SUTURE) ×2 IMPLANT
SUT VIC AB 0 CT1 27 (SUTURE) ×1
SUT VIC AB 0 CT1 27XBRD ANBCTR (SUTURE) ×1 IMPLANT
SUT VIC AB 2-0 CT1 36 (SUTURE) ×2 IMPLANT
SUT VIC AB 2-0 CTB1 (SUTURE) ×2 IMPLANT
SYR CONTROL 10ML LL (SYRINGE) ×4 IMPLANT
TOWEL OR 17X24 6PK STRL BLUE (TOWEL DISPOSABLE) ×2 IMPLANT
TOWEL OR 17X26 10 PK STRL BLUE (TOWEL DISPOSABLE) ×2 IMPLANT
WATER STERILE IRR 1000ML POUR (IV SOLUTION) ×2 IMPLANT

## 2017-11-02 NOTE — Evaluation (Signed)
Physical Therapy Evaluation Patient Details Name: Andrew Pierce MRN: [id number] DOB: [birth year] Today's Date: 11/02/2017   History of Present Illness  Pt is a 52 y/o male with L femur fx, who is s/p L IM nail placement. PMH includes palpitations.   Clinical Impression  Pt s/p surgery above with deficits below. Pt limited this session secondary to lightheadedness. Required min to min guard A for mobility with RW. Anticipate pt will progress well once symptoms improve. Will continue to follow acutely to maximize functional mobility independence and safety.     Follow Up Recommendations Home health PT;Supervision for mobility/OOB    Equipment Recommendations  Rolling walker with 5" wheels;3in1 (PT)    Recommendations for Other Services OT consult     Precautions / Restrictions Precautions Precautions: None Restrictions Weight Bearing Restrictions: Yes LLE Weight Bearing: Weight bearing as tolerated      Mobility  Bed Mobility Overal bed mobility: Needs Assistance Bed Mobility: Supine to Sit;Sit to Supine     Supine to sit: Min assist Sit to supine: Min assist   General bed mobility comments: Min A for LLE assist. Increased time required. Use of bed rails and elevated HOB.   Transfers Overall transfer level: Needs assistance Equipment used: Rolling walker (2 wheeled) Transfers: Sit to/from Stand Sit to Stand: Min assist         General transfer comment: Min A for lift assist and steadying. Verbal cues for safe hand placement.   Ambulation/Gait Ambulation/Gait assistance: Min guard Gait Distance (Feet): 5 Feet Assistive device: Rolling walker (2 wheeled) Gait Pattern/deviations: Step-to pattern;Decreased step length - right;Decreased step length - left;Decreased weight shift to left;Antalgic Gait velocity: Decreased    General Gait Details: Slow, antalgic gait. Min guard and verbal cues for sequencing using RW. Distance limited to within the room as pt with  increased lightheadedness.   Stairs            Wheelchair Mobility    Modified Rankin (Stroke Patients Only)       Balance Overall balance assessment: Needs assistance Sitting-balance support: No upper extremity supported;Feet supported Sitting balance-Leahy Scale: Good     Standing balance support: Bilateral upper extremity supported;During functional activity Standing balance-Leahy Scale: Poor Standing balance comment: Reliant on BUE support.                              Pertinent Vitals/Pain Pain Assessment: Faces Faces Pain Scale: Hurts even more Pain Location: LLE  Pain Descriptors / Indicators: Aching;Operative site guarding Pain Intervention(s): Limited activity within patient's tolerance;Monitored during session;Repositioned    Home Living Family/patient expects to be discharged to:: Private residence Living Arrangements: Spouse/significant other Available Help at Discharge: Family;Available 24 hours/day Type of Home: House Home Access: Stairs to enter Entrance Stairs-Rails: None Entrance Stairs-Number of Steps: 3 Home Layout: One level Home Equipment: None      Prior Function Level of Independence: Independent               Hand Dominance        Extremity/Trunk Assessment   Upper Extremity Assessment Upper Extremity Assessment: Defer to OT evaluation    Lower Extremity Assessment Lower Extremity Assessment: LLE deficits/detail LLE Deficits / Details: Reports tingling in L foot. Deficits consistent with post op pain and weakness.     Cervical / Trunk Assessment Cervical / Trunk Assessment: Normal  Communication   Communication: No difficulties  Cognition Arousal/Alertness: Suspect due to medications Behavior  During Therapy: WFL for tasks assessed/performed Overall Cognitive Status: Within Functional Limits for tasks assessed                                 General Comments: Pt very sleepy at end of  session.       General Comments General comments (skin integrity, edema, etc.): Pt's parents present during admission.     Exercises     Assessment/Plan    PT Assessment Patient needs continued PT services  PT Problem List Decreased strength;Decreased range of motion;Decreased mobility;Decreased activity tolerance;Decreased balance;Decreased knowledge of use of DME;Decreased knowledge of precautions;Pain       PT Treatment Interventions DME instruction;Gait training;Stair training;Functional mobility training;Therapeutic activities;Therapeutic exercise;Balance training;Patient/family education    PT Goals (Current goals can be found in the Care Plan section)  Acute Rehab PT Goals Patient Stated Goal: to feel better  PT Goal Formulation: With patient Time For Goal Achievement: 11/16/17 Potential to Achieve Goals: Good    Frequency Min 5X/week   Barriers to discharge        Co-evaluation               AM-PAC PT "6 Clicks" Daily Activity  Outcome Measure Difficulty turning over in bed (including adjusting bedclothes, sheets and blankets)?: A Little Difficulty moving from lying on back to sitting on the side of the bed? : Unable Difficulty sitting down on and standing up from a chair with arms (e.g., wheelchair, bedside commode, etc,.)?: Unable Help needed moving to and from a bed to chair (including a wheelchair)?: A Little Help needed walking in hospital room?: A Little Help needed climbing 3-5 steps with a railing? : A Lot 6 Click Score: 13    End of Session Equipment Utilized During Treatment: Gait belt;Left knee immobilizer Activity Tolerance: Treatment limited secondary to medical complications (Comment);Patient limited by lethargy(lightheadedness ) Patient left: in bed;with call bell/phone within reach;with family/visitor present Nurse Communication: Mobility status;Other (comment)(lightheadedness ) PT Visit Diagnosis: Other abnormalities of gait and mobility  (R26.89);Pain Pain - Right/Left: Left Pain - part of body: Leg    Time: [id number] PT Time Calculation (min) (ACUTE ONLY): 32 min   Charges:   PT Evaluation $PT Eval Moderate Complexity: 1 Mod PT Treatments $Therapeutic Activity: 8-22 mins   PT G Codes:        Gladys DammeBrittany Marquez Ceesay, PT, DPT  Acute Rehabilitation Services  Pager: [phone]   Lehman PromBrittany S Merrisa Skorupski 11/02/2017, 3:58 PM

## 2017-11-02 NOTE — Transfer of Care (Signed)
Immediate Anesthesia Transfer of Care Note  Patient: Andrew PootClifton C Barmore  Procedure(s) Performed: INTRAMEDULLARY (IM) RETROGRADE FEMORAL NAILING (Left Leg Upper)  Patient Location: PACU  Anesthesia Type:General  Level of Consciousness: awake, alert  and oriented  Airway & Oxygen Therapy: Patient Spontanous Breathing and Patient connected to nasal cannula oxygen  Post-op Assessment: Report given to RN and Post -op Vital signs reviewed and stable  Post vital signs: Reviewed and stable  Last Vitals:  Vitals Value Taken Time  BP 135/74 11/02/2017  9:37 AM  Temp    Pulse 101 11/02/2017  9:40 AM  Resp 17 11/02/2017  9:40 AM  SpO2 100 % 11/02/2017  9:40 AM  Vitals shown include unvalidated device data.  Last Pain:  Vitals:   11/02/17 0508  TempSrc:   PainSc: 8          Complications: No apparent anesthesia complications

## 2017-11-02 NOTE — Anesthesia Postprocedure Evaluation (Signed)
Anesthesia Post Note  Patient: Andrew PootClifton C Kirchoff  Procedure(s) Performed: INTRAMEDULLARY (IM) RETROGRADE FEMORAL NAILING (Left Leg Upper)     Patient location during evaluation: PACU Anesthesia Type: General Level of consciousness: awake and alert Pain management: pain level controlled Vital Signs Assessment: post-procedure vital signs reviewed and stable Respiratory status: spontaneous breathing, nonlabored ventilation and respiratory function stable Cardiovascular status: blood pressure returned to baseline and stable Postop Assessment: no apparent nausea or vomiting Anesthetic complications: no    Last Vitals:  Vitals:   11/02/17 1037 11/02/17 1053  BP: (!) 141/79 (!) 145/83  Pulse: 82 91  Resp: 10 14  Temp:  36.8 C  SpO2: 100% 95%    Last Pain:  Vitals:   11/02/17 1053  TempSrc: Oral  PainSc:                  Lowella CurbWarren Ray Angeli Demilio

## 2017-11-02 NOTE — Anesthesia Procedure Notes (Signed)
Procedure Name: Intubation Date/Time: 11/02/2017 7:47 AM Performed by: Waynard EdwardsSmith, Layah Skousen A, CRNA Pre-anesthesia Checklist: Patient identified, Emergency Drugs available, Suction available, Patient being monitored and Timeout performed Patient Re-evaluated:Patient Re-evaluated prior to induction Oxygen Delivery Method: Circle system utilized Preoxygenation: Pre-oxygenation with 100% oxygen Induction Type: IV induction Ventilation: Mask ventilation without difficulty Laryngoscope Size: Miller and 2 Grade View: Grade I Tube type: Oral Tube size: 7.5 mm Number of attempts: 1 Airway Equipment and Method: Stylet Placement Confirmation: ETT inserted through vocal cords under direct vision,  positive ETCO2 and breath sounds checked- equal and bilateral Secured at: 22 cm Tube secured with: Tape Dental Injury: Teeth and Oropharynx as per pre-operative assessment

## 2017-11-02 NOTE — Discharge Instructions (Signed)
Diet: As you were doing prior to hospitalization   Dressing:  Keep dressings on and dry until follow up.  Activity:  Increase activity slowly as tolerated, but follow the weight bearing instructions below.  The rules on driving is that you can not be taking narcotics while you drive, and you must feel in control of the vehicle.    Pain: If needed, you may increase narcotic pain medication (oxycodone) for the first few days post op to 2 tablets every 4 hours.  Weight Bearing:   As tolerated in knee immobilizer  To prevent constipation: you may use a stool softener such as -  Colace (over the counter) 100 mg by mouth twice a day  Drink plenty of fluids (prune juice may be helpful) and high fiber foods Miralax (over the counter) for constipation as needed.    Itching:  If you experience itching with your medications, try taking only a single pain pill, or even half a pain pill at a time.  You can also use benadryl over the counter for itching or also to help with sleep.   Precautions:  If you experience chest pain or shortness of breath - call 911 immediately for transfer to the hospital emergency department!!  If you develop a fever greater that 101 F, purulent drainage from wound, increased redness or drainage from wound, or calf pain -- Call the office at [phone]                                                 Follow- Up Appointment:  Please call for an appointment to be seen in 1-2 weeks [place] - [phone]

## 2017-11-02 NOTE — Op Note (Signed)
11/02/2017  12:09 PM  PATIENT:  Andrew Pierce    PRE-OPERATIVE DIAGNOSIS:  Left femur fracture  POST-OPERATIVE DIAGNOSIS:  Same  PROCEDURE:  INTRAMEDULLARY (IM) RETROGRADE FEMORAL NAILING  SURGEON:  Blue Ruggerio, Jewel BaizeIMOTHY D, MD  ASSISTANT: Aquilla HackerHenry Martensen, PA-C, he was present and scrubbed throughout the case, critical for completion in a timely fashion, and for retraction, instrumentation, and closure.   ANESTHESIA:   gen  PREOPERATIVE INDICATIONS:  Andrew Pierce is a  52 y.o. male with a diagnosis of Left femur fracture who failed conservative measures and elected for surgical management.    The risks benefits and alternatives were discussed with the patient preoperatively including but not limited to the risks of infection, bleeding, nerve injury, cardiopulmonary complications, the need for revision surgery, among others, and the patient was willing to proceed.  OPERATIVE IMPLANTS: retrograde stryker nail  OPERATIVE FINDINGS: unstable fracture  BLOOD LOSS: 200  COMPLICATIONS: none  TOURNIQUET TIME: none  OPERATIVE PROCEDURE:  Patient was identified in the preoperative holding area and site was marked by me He was transported to the operating theater and placed on the table in supine position taking care to pad all bony prominences. After a preincinduction time out anesthesia was induced. The left lower extremity was prepped and draped in normal sterile fashion and a pre-incision timeout was performed. He received ancef for preoperative antibiotics.   Made an anterior incision over his patella tendon.  And dissected down to the tendon.  I then incised his patella with patella tendon longitudinally keeping all fibers intact but splitting them I inserted the guidepin was careful to protect all chondral surface of surfaces of the knee.  As happy with the guidepin placements I placed the entry reamer and opened up far entry canal entry into the canal.  Next I made a lateral  incision directly over his fracture.  I incised his IT band and used blunt dissection to the femur fracture I was able to subperiosteally dissect the clamp around the fracture and clamp it into place after a reduction maneuver was performed.  Next I inserted the ball-tipped wire up to the piriformis fossa I sequentially reamed to a 13 reamer with good chatter I selected a 11 x 400 nail.  I inserted this nail under fluoroscopic guidance as happy with fracture reduction and nail placement I placed 2 distal interlock screws followed by a proximal interlock using perfect circles technique.  I removed the clamps has thoroughly irrigated the knee joint and all incisions.  I took multiple x-rays and was happy with the hardware placement and alignment of his fracture.  I closed all incisions in layers.  Sterile dressings were applied he was taken to the PACU in stable condition  POST OPERATIVE PLAN: WBAT, mobilize and ASA for dvt px

## 2017-11-02 NOTE — H&P (Addendum)
ORTHOPAEDIC CONSULTATION  REQUESTING PHYSICIAN: Ninetta Lights, MD  Chief Complaint: Left leg pain  HPI: Andrew Pierce is a 52 y.o. male who complains of left leg pain. He notes previous significant pain in the mid thigh of the same leg in the week or so prior to his recent presentation beginning when he was working [employer].  Yesterday he stumbled at home on a depression in his yard and fell backwards flexing his left leg. He had immediate pain and inability to bear weight.  He presented to the emergency department where x-rays showed left femur shaft fracture.  Orthopedics was asked to evaluate and admit the patient.  Today his pain is controlled.  Last meal yesterday.  Allergy to Flexeril- causes hypotension Denies history of MI, CVA, DVT, PE.  Previously ambulatory without aid.     Past Medical History:  Diagnosis Date  . Abnormal stress test   . Chest pain   . Elevated LDL cholesterol level   . Palpitations 02/17/2013   Event monitor 05/03/12-occasional tachycardia, paroxysmal atrial tachycardia, PACs heart rate at times in the 170s. Symptomatic.   Past Surgical History:  Procedure Laterality Date  . CHOLECYSTECTOMY    . HAND SURGERY Left    middle finger removed-table saw accident  . KIDNEY STONE SURGERY     , Removal  . LEFT HEART CATHETERIZATION WITH CORONARY ANGIOGRAM N/A 03/27/2013   Procedure: LEFT HEART CATHETERIZATION WITH CORONARY ANGIOGRAM;  Surgeon: Candee Furbish, MD;  Location: [facility] CATH [facility];  Service: Cardiovascular;  Laterality: N/A;   Social History   Socioeconomic History  . Marital status: Married    Spouse name: Not on file  . Number of children: Not on file  . Years of education: Not on file  . 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 Financial resource strain: Not on file  . Food insecurity:    Worry: Not on file    Inability: Not on file  .  Transportation needs:    Medical: Not on file    Non-medical: Not on file  Tobacco Use  . Smoking status: Never Smoker  . Smokeless tobacco: Never Used  Substance and Sexual Activity  . Alcohol use: Yes  . Drug use: No  . Sexual activity: Not on file  Lifestyle  . Physical activity:    Days per week: Not on file    Minutes per session: Not on file  . Stress: Not on file  Relationships  . Social connections:    Talks on phone: Not on file    Gets together: Not on file    Attends religious service: Not on file    Active member of club or organization: Not on file    Attends meetings of clubs or organizations: Not on file    Relationship status: Not on file  Other Topics Concern  . Not on file  Social History Narrative  . Not on file   Family History  Problem Relation Age of Onset  . Diabetes Mother   . Hypertension Father   . Heart disease Paternal Grandmother   . Heart attack Paternal Grandmother   . Stroke Paternal Grandmother    Allergies  Allergen Reactions  . Flexeril [Cyclobenzaprine] Anaphylaxis   Prior to Admission medications   Medication Sig Start Date End Date Taking? Authorizing Provider  naproxen sodium (ALEVE) 220 MG tablet Take 220-440 mg by  mouth 2 (two) times daily as needed (for pain).    Yes [provider]  [place] 125 MCG tablet TAKE 1 TABLET BY MOUTH EVERY DAY 03/06/15 09/16/15  Jerline Pain, MD   Dg Chest 1 View  Result Date: 11/01/2017 CLINICAL DATA:  Initial evaluation for acute trauma, fall. EXAM: CHEST  1 VIEW COMPARISON:  None. FINDINGS: Patient is slightly rotated to the right. Cardiac and mediastinal silhouettes within normal limits. The lungs are normally inflated. No airspace consolidation, pleural effusion, or pulmonary edema is identified. There is no pneumothorax. No acute osseous abnormality identified. IMPRESSION: No active cardiopulmonary disease. Electronically Signed   By: Jeannine Boga M.D.   On: 11/01/2017 20:39    Dg Pelvis 1-2 Views  Result Date: 11/01/2017 CLINICAL DATA:  Initial evaluation for acute trauma, fall. EXAM: PELVIS - 1-2 VIEW COMPARISON:  None. FINDINGS: No acute fracture or dislocation. Femoral heads in normal alignment within the acetabula. Bony pelvis intact. SI joints approximated. No pubic diastasis. No soft tissue abnormality. IMPRESSION: No acute osseous abnormality about the pelvis. Electronically Signed   By: Jeannine Boga M.D.   On: 11/01/2017 20:33   Dg Knee 1-2 Views Left  Result Date: 11/01/2017 CLINICAL DATA:  Initial evaluation for acute trauma, fall. EXAM: LEFT KNEE - 1-2 VIEW COMPARISON:  None. FINDINGS: Acute comminuted and mildly displaced oblique fracture of the distal left femoral shaft. No other acute fracture or dislocation. No joint effusion. No acute soft tissue abnormality. IMPRESSION: 1. Acute comminuted oblique fracture of the distal left femoral shaft. Electronically Signed   By: Jeannine Boga M.D.   On: 11/01/2017 20:36   Dg Tibia/fibula Left  Result Date: 11/01/2017 CLINICAL DATA:  Initial evaluation for acute trauma, fall. EXAM: LEFT TIBIA AND FIBULA - 2 VIEW COMPARISON:  None. FINDINGS: No acute fracture or dislocation about the left tibia/fibula. Acute fracture involving the distal left femoral shaft partially visualized. No acute soft tissue abnormality. IMPRESSION: 1. No acute osseous abnormality about the left tibia/fibula. 2. Acute fracture involving the distal left fibular shaft, partially visualized. Electronically Signed   By: Jeannine Boga M.D.   On: 11/01/2017 20:34   Dg Femur Min 2 Views Left  Result Date: 11/01/2017 CLINICAL DATA:  Initial evaluation for acute trauma, fall. EXAM: LEFT FEMUR 2 VIEWS COMPARISON:  None. FINDINGS: There is an acute predominantly oblique and slightly comminuted fracture of the distal left femoral shaft. Left hip and knee remain approximated. No acute soft tissue abnormality. 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 IMPRESSION: Acute comminuted predominantly oblique fracture of the distal left femoral shaft. Electronically Signed   By: Jeannine Boga M.D.   On: 11/01/2017 20:37    Positive ROS: All other systems have been reviewed and were otherwise negative with the exception of those mentioned in the HPI and as above.  Objective: Labs cbc Recent Labs    11/01/17 2033  WBC 7.6  HGB 12.7*  HCT 36.1*  PLT 219    Labs inflam No results for input(s): CRP in the last 72 hours.  Invalid input(s): ESR  Labs coag Recent Labs    11/01/17 2033  INR 1.00    Recent Labs    11/01/17 2033  NA 141  K 4.3  CL 107  CO2 28  GLUCOSE 121*  BUN 19  CREATININE 1.06  CALCIUM 8.9    Physical Exam: Vitals:   11/01/17 2221 11/02/17 0410  BP: (!) 151/86 (!) 151/92  Pulse: 81 93  Resp: 19  16  Temp: 98.2 F (36.8 C) 98 F (36.7 C)  SpO2: 97% 97%   General: Alert, no acute distress.  Supine in bed.  Calm, conversant.  Wife at bedside. Mental status: Alert and Oriented x3 Neurologic: Speech Clear and organized, no gross focal findings or movement disorder appreciated. Respiratory: No cyanosis, no use of accessory musculature Cardiovascular: RRR.  No pedal edema GI: Abdomen is soft and non-tender, non-distended. Skin: Warm and dry.  No lesions in the area of chief complaint . Extremities: Warm and well perfused w/o edema Psychiatric: Patient is competent for consent with normal mood and affect  MUSCULOSKELETAL:  Left leg pain with range of motion.  Thigh soft/compressible.  Neurovascular intact distally.  EHL/FHL, dorsiflexion/plantarflexion intact. Other extremities are atraumatic with painless ROM and NVI.  Assessment / Plan: Active Problems:   Other fracture of left femur, initial encounter for closed fracture ([place])   Left femur shaft fracture Plan for operative fixation by Dr. Alain Marion this morning. Nonweightbearing for now.  Will update  postoperatively.  The risks benefits and alternatives of the procedure were discussed with the patient including but not limited to infection, bleeding, nerve injury, the need for revision surgery, blood clots, cardiopulmonary complications, morbidity, mortality, among others.  The patient verbalizes understanding and wishes to proceed.     Prudencio Burly III PA-C 11/02/2017 6:49 AM

## 2017-11-02 NOTE — Anesthesia Preprocedure Evaluation (Signed)
Anesthesia Evaluation  Patient identified by MRN, date of birth, ID band Patient awake    Reviewed: Allergy & Precautions, NPO status , Patient's Chart, lab work & pertinent test results  Airway Mallampati: II  TM Distance: >3 FB Neck ROM: Full    Dental no notable dental hx.    Pulmonary neg pulmonary ROS,    Pulmonary exam normal breath sounds clear to auscultation       Cardiovascular negative cardio ROS Normal cardiovascular exam+ dysrhythmias Supra Ventricular Tachycardia  Rhythm:Regular Rate:Normal     Neuro/Psych negative neurological ROS  negative psych ROS   GI/Hepatic negative GI ROS, Neg liver ROS,   Endo/Other  negative endocrine ROS  Renal/GU negative Renal ROS  negative genitourinary   Musculoskeletal negative musculoskeletal ROS (+)   Abdominal   Peds negative pediatric ROS (+)  Hematology negative hematology ROS (+)   Anesthesia Other Findings   Reproductive/Obstetrics negative OB ROS                             Anesthesia Physical Anesthesia Plan  ASA: II  Anesthesia Plan: General   Post-op Pain Management:    Induction: Intravenous  PONV Risk Score and Plan: 2 and Ondansetron and Midazolam  Airway Management Planned: Oral ETT  Additional Equipment:   Intra-op Plan:   Post-operative Plan: Extubation in OR  Informed Consent: I have reviewed the patients History and Physical, chart, labs and discussed the procedure including the risks, benefits and alternatives for the proposed anesthesia with the patient or authorized representative who has indicated his/her understanding and acceptance.   Dental advisory given  Plan Discussed with: CRNA  Anesthesia Plan Comments:         Anesthesia Quick Evaluation

## 2017-11-02 NOTE — Progress Notes (Signed)
RN called attending MD office to notify of patient arrival to the floor. Spoke to PA, Saks IncorporatedMartensen.

## 2017-11-02 NOTE — Progress Notes (Signed)
Pt stated that he was getting up with assistance (had a knee immobilizer on) and "heard a loud pop" and "now the leg instead of staying straight turns to the left." Pt is in pain despite given pain meds. MD was notified. Per PA, "NWB on that leg tonight, keep the knee immobilizer on and use ice packs. Dr. Eulah PontMurphy will see him in the morning." RN will continue to monitor.

## 2017-11-03 NOTE — Evaluation (Signed)
Occupational Therapy Evaluation and Discharge Patient Details Name: Andrew PootClifton C Malphrus MRN: [id number] DOB: 11/16/1965 Today's Date: 11/03/2017    History of Present Illness Pt is a 52 y/o male with L femur fx, who is s/p L IM nail placement. PMH includes palpitations.    Clinical Impression   Patient evaluated by Occupational Therapy with no further acute OT needs identified. He currently requires min assist for LB ADL and supervision for ambulating toilet and walk-in shower transfers. Educated pt and family concerning safe use of DME for toileting and showering and reinforced that pt should wait until cleared by MD to shower. Pt educated concerning compensatory strategies for LB ADL as well as management of KI. All education has been completed and the patient has no further questions. See below for any follow-up Occupational Therapy or equipment needs. OT to sign off. Thank you for referral.      Follow Up Recommendations  No OT follow up;Supervision/Assistance - 24 hour(initially)    Equipment Recommendations  None recommended by OT    Recommendations for Other Services       Precautions / Restrictions Precautions Precautions: None Required Braces or Orthoses: Knee Immobilizer - Left Knee Immobilizer - Left: Other (comment)(Pt reports MD told to wear 2 weeks; no order noted) Restrictions Weight Bearing Restrictions: No LLE Weight Bearing: Weight bearing as tolerated      Mobility Bed Mobility Overal bed mobility: Needs Assistance Bed Mobility: Supine to Sit;Sit to Supine     Supine to sit: Supervision Sit to supine: Supervision   General bed mobility comments: Supervision for safety. No assistance required.   Transfers Overall transfer level: Needs assistance Equipment used: Rolling walker (2 wheeled) Transfers: Sit to/from Stand Sit to Stand: Supervision         General transfer comment: Supervision for safety. Educated concerning hand placement.     Balance  Overall balance assessment: Needs assistance Sitting-balance support: No upper extremity supported;Feet supported Sitting balance-Leahy Scale: Good     Standing balance support: Bilateral upper extremity supported;During functional activity Standing balance-Leahy Scale: Poor Standing balance comment: Reliant on BUE support.                            ADL either performed or assessed with clinical judgement   ADL Overall ADL's : Needs assistance/impaired Eating/Feeding: Set up;Sitting   Grooming: Supervision/safety;Standing   Upper Body Bathing: Set up;Sitting   Lower Body Bathing: Sit to/from stand;Minimal assistance   Upper Body Dressing : Set up;Sitting   Lower Body Dressing: Minimal assistance;Sit to/from stand   Toilet Transfer: Supervision/safety;Ambulation;RW   Toileting- Clothing Manipulation and Hygiene: Supervision/safety;Sit to/from stand   Tub/ Shower Transfer: Supervision/safety;Ambulation;Rolling walker;Shower seat;Walk-in shower   Functional mobility during ADLs: Supervision/safety;Rolling walker General ADL Comments: Supervision for safety throughout functional mobility. Educated pt concerning compensatory strategies for LB ADL.      Vision Baseline Vision/History: Wears glasses Wears Glasses: At all times Patient Visual Report: No change from baseline Vision Assessment?: No apparent visual deficits     Perception     Praxis      Pertinent Vitals/Pain Pain Assessment: Faces Pain Score: 6  Faces Pain Scale: Hurts a little bit Pain Location: LLE  Pain Descriptors / Indicators: Aching;Operative site guarding Pain Intervention(s): Limited activity within patient's tolerance;Monitored during session;Repositioned     Hand Dominance     Extremity/Trunk Assessment Upper Extremity Assessment Upper Extremity Assessment: Overall WFL for tasks assessed   Lower Extremity Assessment  Lower Extremity Assessment: LLE deficits/detail LLE Deficits  / Details: Decreased strength and ROM as expected post-operatively. Pt wearing KI throughout session.        Communication Communication Communication: No difficulties   Cognition Arousal/Alertness: Awake/alert Behavior During Therapy: WFL for tasks assessed/performed Overall Cognitive Status: Within Functional Limits for tasks assessed                                     General Comments  Multiple family members present during session.     Exercises     Shoulder Instructions      Home Living Family/patient expects to be discharged to:: Private residence Living Arrangements: Spouse/significant other Available Help at Discharge: Family;Available 24 hours/day Type of Home: House Home Access: Stairs to enter [employer] of Steps: 3 Entrance Stairs-Rails: None Home Layout: One level     Bathroom Shower/Tub: Producer, television/film/video: Standard     Home Equipment: None          Prior Functioning/Environment Level of Independence: Independent                 OT Problem List: Decreased strength;Decreased activity tolerance;Impaired balance (sitting and/or standing);Decreased safety awareness;Decreased knowledge of use of DME or AE;Decreased knowledge of precautions;Pain      OT Treatment/Interventions:      OT Goals(Current goals can be found in the care plan section) Acute Rehab OT Goals Patient Stated Goal: to feel better  OT Goal Formulation: With patient Time For Goal Achievement: 11/17/17 Potential to Achieve Goals: Good  OT Frequency:     Barriers to D/C:            Co-evaluation              AM-PAC PT "6 Clicks" Daily Activity     Outcome Measure Help from another person eating meals?: None Help from another person taking care of personal grooming?: None Help from another person toileting, which includes using toliet, bedpan, or urinal?: None Help from another person bathing (including washing, rinsing,  drying)?: A Little Help from another person to put on and taking off regular upper body clothing?: None Help from another person to put on and taking off regular lower body clothing?: A Little 6 Click Score: 22   End of Session Equipment Utilized During Treatment: Rolling walker;Left knee immobilizer  Activity Tolerance: Patient tolerated treatment well Patient left: in bed;with call bell/phone within reach;with family/visitor present  OT Visit Diagnosis: Other abnormalities of gait and mobility (R26.89);Pain Pain - Right/Left: Left Pain - part of body: Leg                Time: 6440-3474 OT Time Calculation (min): 10 min Charges:  OT General Charges $OT Visit: 1 Visit OT Evaluation $OT Eval Low Complexity: 1 Low G-Codes:     Doristine Section, MS OTR/L  Pager: [phone]   Betty Daidone A Loree Shehata 11/03/2017, 11:58 AM

## 2017-11-03 NOTE — Discharge Summary (Signed)
Discharge Summary  Patient ID: Andrew Pierce MRN: [id number] DOB/AGE: 1966-03-29 52 y.o.  Admit date: 11/01/2017 Discharge date: 11/03/2017  Admission Diagnoses:  <principal problem not specified>  Discharge Diagnoses:  Active Problems:   Other fracture of left femur, initial encounter for closed fracture [facility](HCC)   Past Medical History:  Diagnosis Date  . Abnormal stress test   . Chest pain   . Elevated LDL cholesterol level   . Palpitations 02/17/2013   Event monitor 05/03/12-occasional tachycardia, paroxysmal atrial tachycardia, PACs heart rate at times in the 170s. Symptomatic.    Surgeries: Procedure(s): INTRAMEDULLARY (IM) RETROGRADE FEMORAL NAILING on 11/02/2017   Consultants (if any):   Discharged Condition: Improved  Hospital Course: Andrew PootClifton C Christopoulos is an 52 y.o. male who was admitted 11/01/2017 with a diagnosis of <principal problem not specified> and went to the operating room on 11/02/2017 and underwent the above named procedures.    He was given perioperative antibiotics:  Anti-infectives (From admission, onward)   Start     Dose/Rate Route Frequency Ordered Stop   11/02/17 1100  ceFAZolin (ANCEF) IVPB 2g/100 mL premix     2 g 200 mL/hr over 30 Minutes Intravenous Every 6 hours 11/02/17 1054 11/03/17 0009   11/02/17 0736  ceFAZolin (ANCEF) 2-4 GM/100ML-% IVPB    Note to Pharmacy:  Annabelle HarmanSmith, Alyssia   : cabinet override      11/02/17 0736 11/02/17 1944    .  He was given sequential compression devices, early ambulation, and Lovenox for DVT prophylaxis.  He benefited maximally from the hospital stay and there were no complications.    Recent vital signs:  Vitals:   11/03/17 0016 11/03/17 0342  BP: 112/68 111/69  Pulse: 81 78  Resp:  13  Temp: 98.2 F (36.8 C) 98.4 F (36.9 C)  SpO2: 92% 95%    Recent laboratory studies:  Lab Results  Component Value Date   HGB 12.3 (L) 11/02/2017   HGB 12.7 (L) 11/01/2017   HGB 14.5 03/24/2013   Lab Results   Component Value Date   WBC 13.9 (H) 11/02/2017   PLT 221 11/02/2017   Lab Results  Component Value Date   INR 1.00 11/01/2017   Lab Results  Component Value Date   NA 141 11/01/2017   K 4.3 11/01/2017   CL 107 11/01/2017   CO2 28 11/01/2017   BUN 19 11/01/2017   CREATININE 0.86 11/02/2017   GLUCOSE 121 (H) 11/01/2017    Discharge Medications:   Allergies as of 11/03/2017      Reactions   Flexeril [cyclobenzaprine] Anaphylaxis      Medication List    STOP taking these medications   naproxen sodium 220 MG tablet Commonly known as:  ALEVE     TAKE these medications   acetaminophen 500 MG tablet Commonly known as:  TYLENOL Take 2 tablets (1,000 mg total) by mouth every 8 (eight) hours for 14 days. For Pain.   aspirin EC 81 MG tablet Take 1 tablet (81 mg total) by mouth 2 (two) times daily. For DVT prophylaxis   celecoxib 200 MG capsule Commonly known as:  CELEBREX Take 1 capsule (200 mg total) by mouth 2 (two) times daily for 14 days. For 2 weeks post op for pain and inflammation.  Discontinue Ibuprofen or other Anti-inflammatory medicine when taking this medicine.   docusate sodium 100 MG capsule Commonly known as:  COLACE Take 1 capsule (100 mg total) by mouth 2 (two) times daily. To prevent constipation while  taking pain medication.   methocarbamol 500 MG tablet Commonly known as:  ROBAXIN Take 1 tablet (500 mg total) by mouth every 6 (six) hours as needed for muscle spasms.   omeprazole 20 MG capsule Commonly known as:  PRILOSEC Take 1 capsule (20 mg total) by mouth daily. 30 days for gastroprotection while taking NSAIDs.   ondansetron 4 MG tablet Commonly known as:  ZOFRAN Take 1 tablet (4 mg total) by mouth every 8 (eight) hours as needed for nausea or vomiting.   oxyCODONE 5 MG immediate release tablet Commonly known as:  ROXICODONE Take 1 tablet (5 mg total) by mouth every 4 (four) hours as needed for breakthrough pain.            Durable  Medical Equipment  (From admission, onward)        Start     Ordered   11/02/17 1055  DME Walker rolling  Once    Question:  Patient needs a walker to treat with the following condition  Answer:  Other fracture of left femur, initial encounter for closed fracture [facility])   11/02/17 1054      Diagnostic Studies: Dg Chest 1 View  Result Date: 11/01/2017 CLINICAL DATA:  Initial evaluation for acute trauma, fall. EXAM: CHEST  1 VIEW COMPARISON:  None. FINDINGS: Patient is slightly rotated to the right. Cardiac and mediastinal silhouettes within normal limits. The lungs are normally inflated. No airspace consolidation, pleural effusion, or pulmonary edema is identified. There is no pneumothorax. No acute osseous abnormality identified. IMPRESSION: No active cardiopulmonary disease. Electronically Signed   By: Rise Mu M.D.   On: 11/01/2017 20:39   Dg Pelvis 1-2 Views  Result Date: 11/01/2017 CLINICAL DATA:  Initial evaluation for acute trauma, fall. EXAM: PELVIS - 1-2 VIEW COMPARISON:  None. FINDINGS: No acute fracture or dislocation. Femoral heads in normal alignment within the acetabula. Bony pelvis intact. SI joints approximated. No pubic diastasis. No soft tissue abnormality. IMPRESSION: No acute osseous abnormality about the pelvis. Electronically Signed   By: Rise Mu M.D.   On: 11/01/2017 20:33   Dg Knee 1-2 Views Left  Result Date: 11/01/2017 CLINICAL DATA:  Initial evaluation for acute trauma, fall. EXAM: LEFT KNEE - 1-2 VIEW COMPARISON:  None. FINDINGS: Acute comminuted and mildly displaced oblique fracture of the distal left femoral shaft. No other acute fracture or dislocation. No joint effusion. No acute soft tissue abnormality. IMPRESSION: 1. Acute comminuted oblique fracture of the distal left femoral shaft. Electronically Signed   By: Rise Mu M.D.   On: 11/01/2017 20:36   Dg Tibia/fibula Left  Result Date: 11/01/2017 CLINICAL DATA:  Initial  evaluation for acute trauma, fall. EXAM: LEFT TIBIA AND FIBULA - 2 VIEW COMPARISON:  None. FINDINGS: No acute fracture or dislocation about the left tibia/fibula. Acute fracture involving the distal left femoral shaft partially visualized. No acute soft tissue abnormality. IMPRESSION: 1. No acute osseous abnormality about the left tibia/fibula. 2. Acute fracture involving the distal left fibular shaft, partially visualized. Electronically Signed   By: Rise Mu M.D.   On: 11/01/2017 20:34   Dg C-arm 1-60 Min  Result Date: 11/02/2017 CLINICAL DATA:  Status post ORIF for a left femur fracture. EXAM: LEFT FEMUR 2 VIEWS; DG C-ARM 61-120 MIN COMPARISON:  Preoperative study of November 01, 2017. FINDINGS: Six fluoro spot images are submitted. The patient has undergone placement of an intramedullary rod with securing screws proximally and distally for a fracture involving the middle and distal thirds  of the femur. The fracture fragments are near anatomic in alignment. IMPRESSION: No postprocedure complication following ORIF for a femoral shaft fracture. Electronically Signed   By: David  [place] M.D.   On: 11/02/2017 09:16   Dg Femur Min 2 Views Left  Result Date: 11/02/2017 CLINICAL DATA:  Status post ORIF for a left femur fracture. EXAM: LEFT FEMUR 2 VIEWS; DG C-ARM 61-120 MIN COMPARISON:  Preoperative study of November 01, 2017. FINDINGS: Six fluoro spot images are submitted. The patient has undergone placement of an intramedullary rod with securing screws proximally and distally for a fracture involving the middle and distal thirds of the femur. The fracture fragments are near anatomic in alignment. IMPRESSION: No postprocedure complication following ORIF for a femoral shaft fracture. Electronically Signed   By: David  [place] M.D.   On: 11/02/2017 09:16   Dg Femur Min 2 Views Left  Result Date: 11/01/2017 CLINICAL DATA:  Initial evaluation for acute trauma, fall. EXAM: LEFT FEMUR 2 VIEWS COMPARISON:   None. FINDINGS: There is an acute predominantly oblique and slightly comminuted fracture of the distal left femoral shaft. Left hip and knee remain approximated. No acute soft tissue abnormality. 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 IMPRESSION: Acute comminuted predominantly oblique fracture of the distal left femoral shaft. Electronically Signed   By: Rise Mu M.D.   On: 11/01/2017 20:37   Dg Femur Port Min 2 Views Left  Result Date: 11/03/2017 CLINICAL DATA:  Patient felt a pop in the left femur 1 getting up from bed. Surgery today for repair of left femoral fracture. EXAM: LEFT FEMUR PORTABLE 2 VIEWS COMPARISON:  1131 hours on the same day FINDINGS: Stable appearing intramedullary nail fixation across a known distal left femoral diaphyseal fracture of the femur. No significant change in the appearance of the femoral fractures. Air-fluid level within the suprapatellar compartment of the knee from postop change as well soft tissue emphysema as before. IMPRESSION: No new complicating features post reverse intramedullary nail fixation of the left femur. Distal diaphyseal fractures of the left femur are near anatomic. Electronically Signed   By: Tollie Eth M.D.   On: 11/03/2017 02:41   Dg Femur Port Min 2 Views Left  Result Date: 11/02/2017 CLINICAL DATA:  52 year old male with left femur fracture. Subsequent encounter. EXAM: LEFT FEMUR PORTABLE 2 VIEWS COMPARISON:  Intraoperative C-arm views 11/02/2017. Preoperative exam 11/01/2017. FINDINGS: Post placement of left intramedullary rod with proximal and distal fixation screws for treatment of left femoral shaft fracture. No complication noted. Fracture fragments minimally separated. IMPRESSION: Open reduction and internal fixation of left femur fracture. Electronically Signed   By: Lacy Duverney M.D.   On: 11/02/2017 12:22    Disposition: Discharge disposition: 01-Home or Self Care         Follow-up Information    Sheral Apley, MD Follow up in 2 week(s).   Specialty:  Orthopedic Surgery Contact information: [address] [place] [address] [phone]            Signed: Albina Billet III PA-C 11/03/2017, 7:14 AM

## 2017-11-03 NOTE — Care Management Note (Signed)
Case Management Note  Patient Details  Name: Andrew Pierce MRN: [id number] Date of Birth: [birth year]  Subjective/Objective:  52 yr old gentleman s/p IM Nailing of left femur fracture.                  Action/Plan: Case manager spoke with patient's wife concerning discharge plan and DME needs. Choice for Home Health agency was offered. Referral was called to [facility]on Mil[facility]an [facility], [facility]. He will have family support at discharge.    Expected Discharge Date:  11/03/17               Expected Discharge Plan:  Home w Home Health Services  In-House Referral:  NA  Discharge planning Services  CM Consult  Post Acute Care Choice:  Durable Medical Equipment, Home Health Choice offered to:  Patient  DME Arranged:  3-N-1, Walker rolling DME Agency:  [facility]  [facility]  [facility]  Status of Service:  Completed, signed off  If discussed at Long Length of Stay Meetings, dates discussed:    Additional Comments:  [name], [name], RN 11/03/2017, 11:03 AM

## 2017-11-03 NOTE — Progress Notes (Signed)
AVS and printed prescriptions given and reviewed with pt and pt's wife. All questions answered to satisfaction. Equipment delivered to bedside. Pt escorted off the unit via wheelchair by volunteer services.

## 2017-11-03 NOTE — Progress Notes (Signed)
Physical Therapy Treatment Patient Details Name: Andrew PootClifton C Pierce MRN: [id number] DOB: Aug 22, 1965 Today's Date: 11/03/2017    History of Present Illness Pt is a 52 y/o male with L femur fx, who is s/p L IM nail placement. PMH includes palpitations.     PT Comments    Pt progressing well with post-op mobility. We were able to initiate stair training this session and pt completed with min guard assist and wife present for education. Pt anticipates d/c home today. He and wife were also educated on mobility progression around the home, and car transfer. Will continue to follow and progress as able per POC.     Follow Up Recommendations  Home health PT;Supervision for mobility/OOB     Equipment Recommendations  Rolling walker with 5" wheels;3in1 (PT)    Recommendations for Other Services OT consult     Precautions / Restrictions Precautions Precautions: None Required Braces or Orthoses: Knee Immobilizer - Left Knee Immobilizer - Left: Other (comment)(Pt reports MD told him to wear for 2 weeks - no order noted) Restrictions Weight Bearing Restrictions: No LLE Weight Bearing: Weight bearing as tolerated    Mobility  Bed Mobility Overal bed mobility: Needs Assistance Bed Mobility: Supine to Sit;Sit to Supine     Supine to sit: Min assist Sit to supine: Min guard   General bed mobility comments: Assist for LLE advancement towards R side of bed to simulate home environment. Pt was able to elevate LE back up into bed at end of session.   Transfers Overall transfer level: Needs assistance Equipment used: Rolling walker (2 wheeled) Transfers: Sit to/from Stand Sit to Stand: Min guard         General transfer comment: Close guard for safety as pt powered up to full stand. No assist required.   Ambulation/Gait Ambulation/Gait assistance: Min guard Gait Distance (Feet): 100 Feet Assistive device: Rolling walker (2 wheeled) Gait Pattern/deviations: Step-to pattern;Decreased  step length - right;Decreased step length - left;Decreased weight shift to left;Antalgic Gait velocity: Decreased  Gait velocity interpretation: <1.31 ft/sec, indicative of household ambulator General Gait Details: Slow and guarded. Hands-on guarding provided throughout gait training for balance support and safety. Chair follow utilized as pt reported feeling "woozy" once up and walking.    Stairs Stairs: Yes Stairs assistance: Min guard Stair Management: No rails;Backwards;With walker Number of Stairs: 2 General stair comments: Pt and wife were educated on stair negotiation backwards with walker. Pt performed well and wife was given handout.    Wheelchair Mobility    Modified Rankin (Stroke Patients Only)       Balance Overall balance assessment: Needs assistance Sitting-balance support: No upper extremity supported;Feet supported Sitting balance-Leahy Scale: Good     Standing balance support: Bilateral upper extremity supported;During functional activity Standing balance-Leahy Scale: Poor Standing balance comment: Reliant on BUE support.                             Cognition Arousal/Alertness: Awake/alert Behavior During Therapy: WFL for tasks assessed/performed Overall Cognitive Status: Within Functional Limits for tasks assessed                                        Exercises      General Comments        Pertinent Vitals/Pain Pain Assessment: 0-10 Pain Score: 6  Pain Location: LLE  Pain  Descriptors / Indicators: Aching;Operative site guarding Pain Intervention(s): Monitored during session    Home Living                      Prior Function            PT Goals (current goals can now be found in the care plan section) Acute Rehab PT Goals Patient Stated Goal: to feel better  PT Goal Formulation: With patient Time For Goal Achievement: 11/16/17 Potential to Achieve Goals: Good Progress towards PT goals: Progressing  toward goals    Frequency    Min 5X/week      PT Plan Current plan remains appropriate    Co-evaluation              AM-PAC PT "6 Clicks" Daily Activity  Outcome Measure  Difficulty turning over in bed (including adjusting bedclothes, sheets and blankets)?: None Difficulty moving from lying on back to sitting on the side of the bed? : A Little Difficulty sitting down on and standing up from a chair with arms (e.g., wheelchair, bedside commode, etc,.)?: A Little Help needed moving to and from a bed to chair (including a wheelchair)?: A Little Help needed walking in hospital room?: A Little Help needed climbing 3-5 steps with a railing? : A Little 6 Click Score: 19    End of Session Equipment Utilized During Treatment: Gait belt;Left knee immobilizer Activity Tolerance: Patient tolerated treatment well Patient left: in bed;with call bell/phone within reach;with family/visitor present Nurse Communication: Mobility status PT Visit Diagnosis: Other abnormalities of gait and mobility (R26.89);Pain Pain - Right/Left: Left Pain - part of body: Leg     Time: 4098-1191 PT Time Calculation (min) (ACUTE ONLY): 34 min  Charges:  $Gait Training: 23-37 mins                    G Codes:       Conni Slipper, PT, DPT Acute Rehabilitation Services Pager: [phone]    Marylynn Pearson 11/03/2017, 10:22 AM

## 2017-11-03 NOTE — Progress Notes (Signed)
    Subjective: Patient reports pain as mild to moderate, controlled.  Tolerating diet.  Urinating.  +Flatus.  No CP, SOB.  OOB With therapy  Objective:   VITALS:   Vitals:   11/02/17 1053 11/02/17 2020 11/03/17 0016 11/03/17 0342  BP: (!) 145/83 124/74 112/68 111/69  Pulse: 91 83 81 78  Resp: 14   13  Temp: 98.2 F (36.8 C) 98.3 F (36.8 C) 98.2 F (36.8 C) 98.4 F (36.9 C)  TempSrc: Oral Oral Oral Oral  SpO2: 95% 96% 92% 95%  Weight:      Height:       CBC Latest Ref Rng & Units 11/02/2017 11/01/2017 03/24/2013  WBC 4.0 - 10.5 K/uL 13.9(H) 7.6 6.1  Hemoglobin 13.0 - 17.0 g/dL 12.3(L) 12.7(L) 14.5  Hematocrit 39.0 - 52.0 % 34.8(L) 36.1(L) 40.2  Platelets 150 - 400 K/uL 221 219 255.0   BMP Latest Ref Rng & Units 11/02/2017 11/01/2017 03/24/2013  Glucose 65 - 99 mg/dL - 308(M121(H) 78  BUN 6 - 20 mg/dL - 19 11  Creatinine 5.780.61 - 1.24 mg/dL 4.690.86 6.291.06 0.9  Sodium 528135 - 145 mmol/L - 141 137  Potassium 3.5 - 5.1 mmol/L - 4.3 4.0  Chloride 101 - 111 mmol/L - 107 102  CO2 22 - 32 mmol/L - 28 28  Calcium 8.9 - 10.3 mg/dL - 8.9 9.3   Intake/Output      06/18 0701 - 06/19 0700 06/19 0701 - 06/20 0700   P.O. 680    I.V. (mL/kg) 1000 (11.4)    IV Piggyback 0    Total Intake(mL/kg) 1680 (19.2)    Urine (mL/kg/hr) 300 (0.1)    Emesis/NG output 300    Blood 200    Total Output 800    Net +880            Physical Exam: General: NAD.  Upright in bed on arrival.  Calm, conversant.  Wife at bedside. Resp: No increased wob Cardio: regular rate and rhythm ABD soft Neurologically intact MSK Knee immobilizer in place Neurovascularly intact Sensation intact distally Feet warm Dorsiflexion/Plantar flexion intact Incision: dressing C/D/I   Assessment: 1 Day Post-Op  S/P Procedure(s) (LRB): INTRAMEDULLARY (IM) RETROGRADE FEMORAL NAILING (Left) by Dr. Jewel Baizeimothy D. Eulah PontMurphy on 11/02/2016  Active Problems:   Other fracture of left femur, initial encounter for closed fracture  (HCC)  Left femur fracture status post retrograde IM nail Doing well postop day 1 Eating, drinking, and voiding Pain controlled Good early mobilization, would benefit from 1 more PT session this morning before discharge   Plan:  Advance diet Up with therapy D/C IV fluids Incentive Spirometry Elevate and Apply ice  Weightbearing: WBAT LLE in the immobilizer Insicional and dressing care: Dressings left intact until follow-up Orthopedic device(s): Knee immobilizer Showering: Keep dressing dry VTE prophylaxis: Lovenox while inpatient.  Aspirin at home after discharge.  SCDs, ambulation Pain control: Continue current regimen Follow - up plan: LLE follow-up with Dr. Eulah PontMurphy in the office in 1 to 2 weeks. Contact information:  Margarita Ranaimothy Murphy MD, Aquilla HackerHenry Martensen PA-C  Dispo: Home today after a.m. therapy   Albina BilletHenry Calvin Martensen III, PA-C 11/03/2017, 7:05 AM

## 2017-11-04 DIAGNOSIS — Z79891 Long term (current) use of opiate analgesic: Secondary | ICD-10-CM | POA: Diagnosis not present

## 2017-11-04 DIAGNOSIS — S72492D Other fracture of lower end of left femur, subsequent encounter for closed fracture with routine healing: Secondary | ICD-10-CM | POA: Diagnosis not present

## 2017-11-04 DIAGNOSIS — Z791 Long term (current) use of non-steroidal anti-inflammatories (NSAID): Secondary | ICD-10-CM | POA: Diagnosis not present

## 2017-11-04 DIAGNOSIS — Z7982 Long term (current) use of aspirin: Secondary | ICD-10-CM | POA: Diagnosis not present

## 2017-11-05 ENCOUNTER — Encounter (HOSPITAL_COMMUNITY): Payer: Self-pay | Admitting: Orthopedic Surgery

## 2017-11-17 DIAGNOSIS — S72352D Displaced comminuted fracture of shaft of left femur, subsequent encounter for closed fracture with routine healing: Secondary | ICD-10-CM | POA: Diagnosis not present

## 2017-11-23 DIAGNOSIS — M79605 Pain in left leg: Secondary | ICD-10-CM | POA: Diagnosis not present

## 2017-11-23 DIAGNOSIS — S72352D Displaced comminuted fracture of shaft of left femur, subsequent encounter for closed fracture with routine healing: Secondary | ICD-10-CM | POA: Diagnosis not present

## 2017-11-29 DIAGNOSIS — S72352D Displaced comminuted fracture of shaft of left femur, subsequent encounter for closed fracture with routine healing: Secondary | ICD-10-CM | POA: Diagnosis not present

## 2017-11-30 DIAGNOSIS — M79605 Pain in left leg: Secondary | ICD-10-CM | POA: Diagnosis not present

## 2017-11-30 DIAGNOSIS — S72352D Displaced comminuted fracture of shaft of left femur, subsequent encounter for closed fracture with routine healing: Secondary | ICD-10-CM | POA: Diagnosis not present

## 2017-12-02 DIAGNOSIS — M79605 Pain in left leg: Secondary | ICD-10-CM | POA: Diagnosis not present

## 2017-12-02 DIAGNOSIS — S72352D Displaced comminuted fracture of shaft of left femur, subsequent encounter for closed fracture with routine healing: Secondary | ICD-10-CM | POA: Diagnosis not present

## 2017-12-07 DIAGNOSIS — S72352D Displaced comminuted fracture of shaft of left femur, subsequent encounter for closed fracture with routine healing: Secondary | ICD-10-CM | POA: Diagnosis not present

## 2017-12-07 DIAGNOSIS — M79605 Pain in left leg: Secondary | ICD-10-CM | POA: Diagnosis not present

## 2017-12-09 DIAGNOSIS — S72352D Displaced comminuted fracture of shaft of left femur, subsequent encounter for closed fracture with routine healing: Secondary | ICD-10-CM | POA: Diagnosis not present

## 2017-12-09 DIAGNOSIS — M79605 Pain in left leg: Secondary | ICD-10-CM | POA: Diagnosis not present

## 2017-12-14 DIAGNOSIS — M79605 Pain in left leg: Secondary | ICD-10-CM | POA: Diagnosis not present

## 2017-12-14 DIAGNOSIS — S72352D Displaced comminuted fracture of shaft of left femur, subsequent encounter for closed fracture with routine healing: Secondary | ICD-10-CM | POA: Diagnosis not present

## 2017-12-15 DIAGNOSIS — M79605 Pain in left leg: Secondary | ICD-10-CM | POA: Diagnosis not present

## 2017-12-16 DIAGNOSIS — S72352D Displaced comminuted fracture of shaft of left femur, subsequent encounter for closed fracture with routine healing: Secondary | ICD-10-CM | POA: Diagnosis not present

## 2017-12-16 DIAGNOSIS — M79605 Pain in left leg: Secondary | ICD-10-CM | POA: Diagnosis not present

## 2017-12-24 DIAGNOSIS — M79605 Pain in left leg: Secondary | ICD-10-CM | POA: Diagnosis not present

## 2017-12-24 DIAGNOSIS — S72352D Displaced comminuted fracture of shaft of left femur, subsequent encounter for closed fracture with routine healing: Secondary | ICD-10-CM | POA: Diagnosis not present

## 2017-12-28 DIAGNOSIS — M79605 Pain in left leg: Secondary | ICD-10-CM | POA: Diagnosis not present

## 2017-12-28 DIAGNOSIS — S72352D Displaced comminuted fracture of shaft of left femur, subsequent encounter for closed fracture with routine healing: Secondary | ICD-10-CM | POA: Diagnosis not present

## 2017-12-30 DIAGNOSIS — S72352D Displaced comminuted fracture of shaft of left femur, subsequent encounter for closed fracture with routine healing: Secondary | ICD-10-CM | POA: Diagnosis not present

## 2017-12-30 DIAGNOSIS — M79605 Pain in left leg: Secondary | ICD-10-CM | POA: Diagnosis not present

## 2018-01-06 DIAGNOSIS — M79605 Pain in left leg: Secondary | ICD-10-CM | POA: Diagnosis not present

## 2018-01-06 DIAGNOSIS — S72352D Displaced comminuted fracture of shaft of left femur, subsequent encounter for closed fracture with routine healing: Secondary | ICD-10-CM | POA: Diagnosis not present

## 2018-01-12 DIAGNOSIS — S72352D Displaced comminuted fracture of shaft of left femur, subsequent encounter for closed fracture with routine healing: Secondary | ICD-10-CM | POA: Diagnosis not present

## 2018-01-13 DIAGNOSIS — M79605 Pain in left leg: Secondary | ICD-10-CM | POA: Diagnosis not present

## 2018-01-13 DIAGNOSIS — S72352D Displaced comminuted fracture of shaft of left femur, subsequent encounter for closed fracture with routine healing: Secondary | ICD-10-CM | POA: Diagnosis not present

## 2018-01-19 DIAGNOSIS — S72352D Displaced comminuted fracture of shaft of left femur, subsequent encounter for closed fracture with routine healing: Secondary | ICD-10-CM | POA: Diagnosis not present

## 2018-01-19 DIAGNOSIS — M79605 Pain in left leg: Secondary | ICD-10-CM | POA: Diagnosis not present

## 2018-01-26 DIAGNOSIS — Z23 Encounter for immunization: Secondary | ICD-10-CM | POA: Diagnosis not present

## 2018-01-26 DIAGNOSIS — R51 Headache: Secondary | ICD-10-CM | POA: Diagnosis not present

## 2018-01-27 ENCOUNTER — Other Ambulatory Visit: Payer: Self-pay | Admitting: Family Medicine

## 2018-01-27 DIAGNOSIS — R519 Headache, unspecified: Secondary | ICD-10-CM

## 2018-01-27 DIAGNOSIS — R51 Headache: Principal | ICD-10-CM

## 2018-02-02 DIAGNOSIS — S72352D Displaced comminuted fracture of shaft of left femur, subsequent encounter for closed fracture with routine healing: Secondary | ICD-10-CM | POA: Diagnosis not present

## 2018-02-02 DIAGNOSIS — M79605 Pain in left leg: Secondary | ICD-10-CM | POA: Diagnosis not present

## 2018-02-03 ENCOUNTER — Ambulatory Visit
Admission: RE | Admit: 2018-02-03 | Discharge: 2018-02-03 | Disposition: A | Discharge status: Home / Self Care | Payer: BLUE CROSS/BLUE SHIELD | Source: Ambulatory Visit | Attending: Family Medicine | Admitting: Family Medicine

## 2018-02-03 DIAGNOSIS — G4484 Primary exertional headache: Secondary | ICD-10-CM | POA: Diagnosis not present

## 2018-02-03 DIAGNOSIS — R519 Headache, unspecified: Secondary | ICD-10-CM

## 2018-02-03 DIAGNOSIS — R51 Headache: Principal | ICD-10-CM

## 2018-02-09 DIAGNOSIS — M79605 Pain in left leg: Secondary | ICD-10-CM | POA: Diagnosis not present

## 2018-03-30 DIAGNOSIS — M79605 Pain in left leg: Secondary | ICD-10-CM | POA: Diagnosis not present

## 2018-04-21 DIAGNOSIS — T84498A Other mechanical complication of other internal orthopedic devices, implants and grafts, initial encounter: Secondary | ICD-10-CM | POA: Diagnosis not present

## 2018-04-21 DIAGNOSIS — T8484XA Pain due to internal orthopedic prosthetic devices, implants and grafts, initial encounter: Secondary | ICD-10-CM | POA: Diagnosis not present

## 2018-05-02 DIAGNOSIS — T84498D Other mechanical complication of other internal orthopedic devices, implants and grafts, subsequent encounter: Secondary | ICD-10-CM | POA: Diagnosis not present

## 2018-05-04 ENCOUNTER — Telehealth: Payer: Self-pay | Admitting: *Deleted

## 2018-05-04 NOTE — Telephone Encounter (Signed)
-----   Message from Bobbye CharlestonJessica C Caballero sent at 04/28/2018 10:48 AM EST ----- Contact: [phone] Good Morning,  Mr. Doroteo Glassmanhelps left a message on our Voice mail if you can call him back. About his blood thinner.  Thank you.

## 2018-05-05 NOTE — Telephone Encounter (Signed)
Left message for pt to call back to discuss his concerns °

## 2018-05-06 NOTE — Telephone Encounter (Signed)
Spoke with patient who was wanting to know when he was started on Digoxin and when it was discontinued.  Reviewed chart and informed pt of dates.  (08/18/2012  to 09/16/2015)  He was grateful for the c/b and information.

## 2018-05-16 DIAGNOSIS — Z125 Encounter for screening for malignant neoplasm of prostate: Secondary | ICD-10-CM | POA: Diagnosis not present

## 2018-05-16 DIAGNOSIS — Z0000 Encounter for general adult medical examination without abnormal findings: Secondary | ICD-10-CM | POA: Diagnosis not present

## 2018-05-16 DIAGNOSIS — E785 Hyperlipidemia, unspecified: Secondary | ICD-10-CM | POA: Diagnosis not present

## 2019-08-31 IMAGING — CR DG TIBIA/FIBULA 2V*L*
4 series · 4 of 4 positions shown · non-contrast
Comparison: None.

CLINICAL DATA: Initial evaluation for acute trauma, fall.

EXAM:
LEFT TIBIA AND FIBULA - 2 VIEW

[tibia lat (1 of 2)]
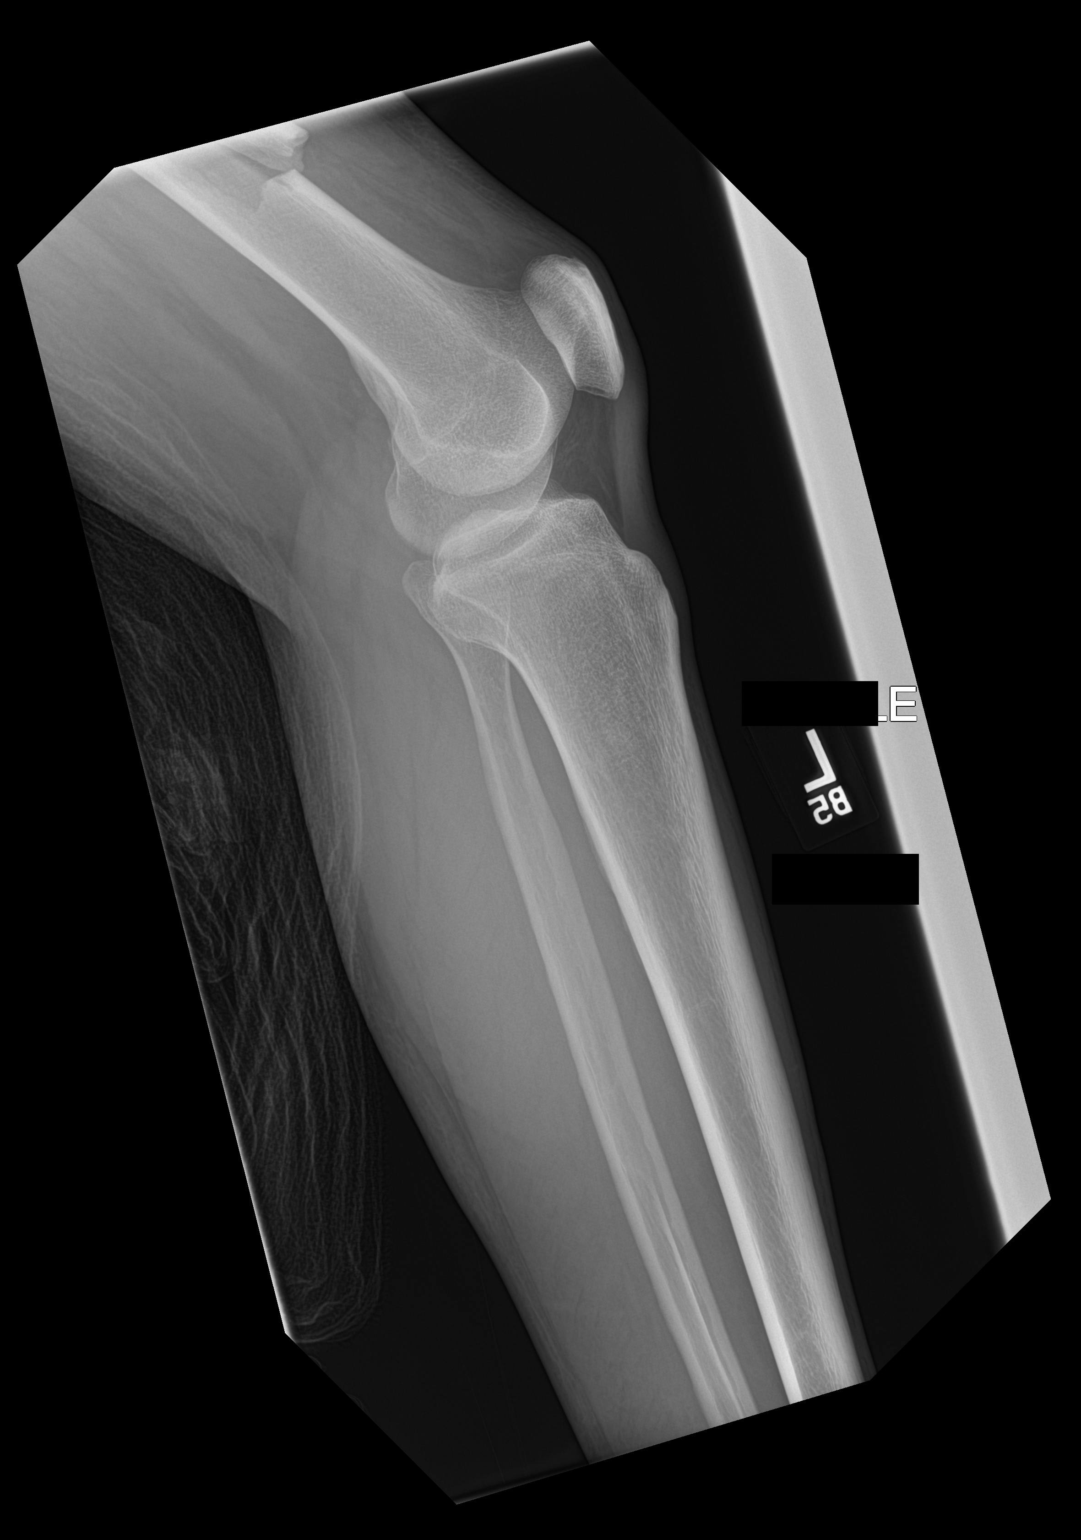

[tibia lat (2 of 2)]
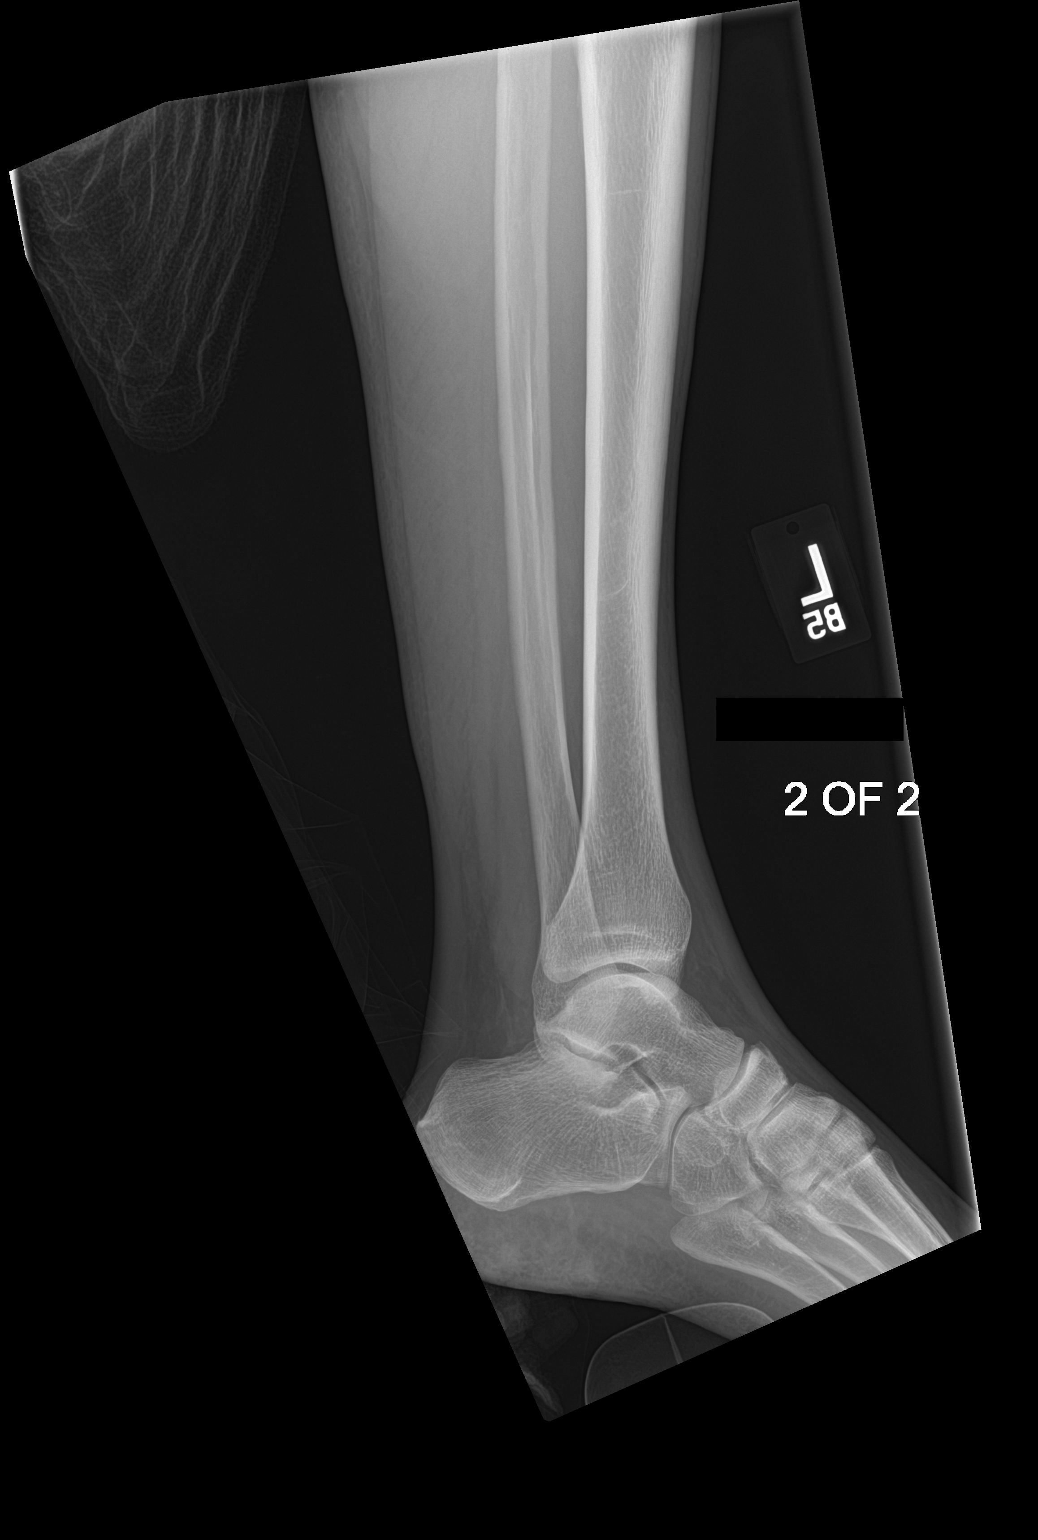

[tibia ap (1 of 2)]
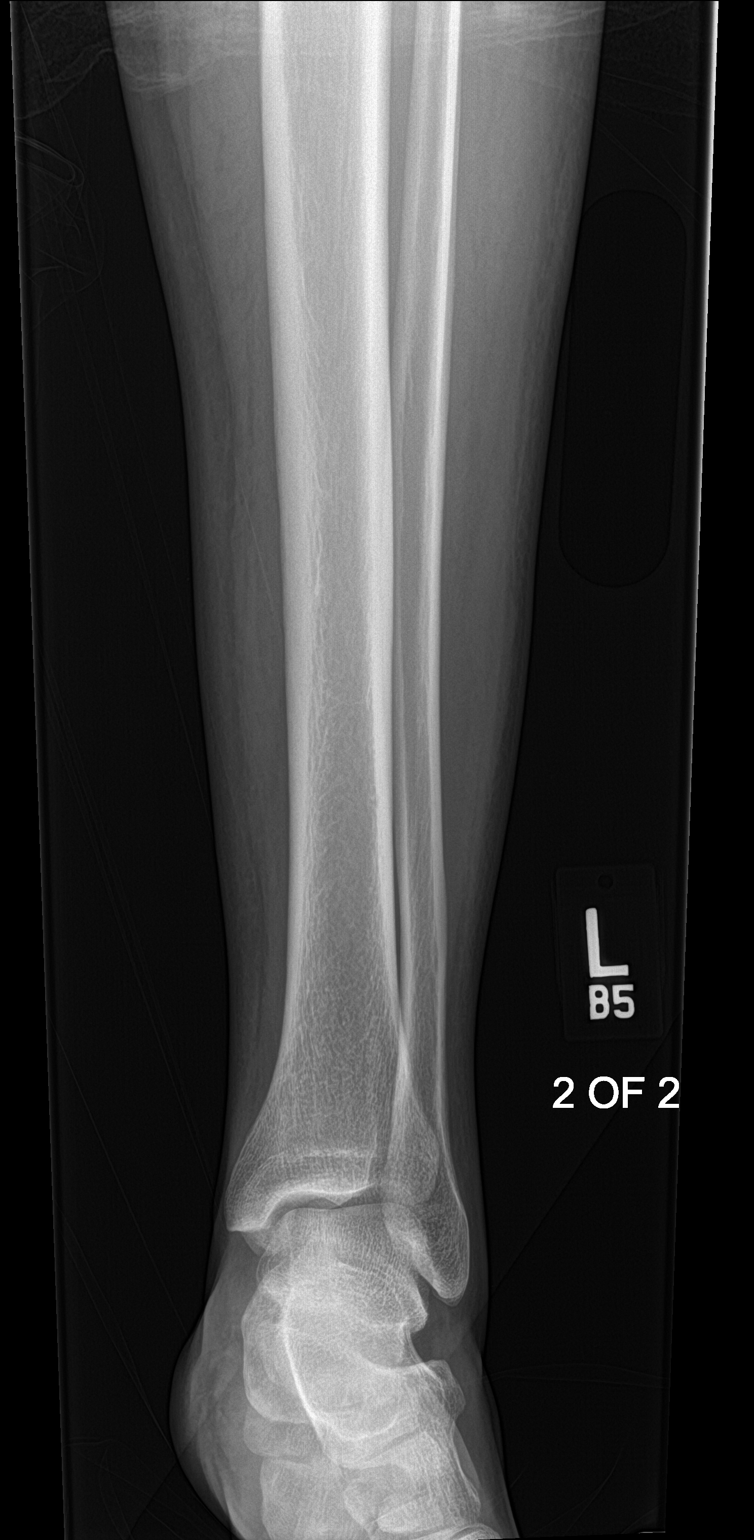

[tibia ap (2 of 2)]
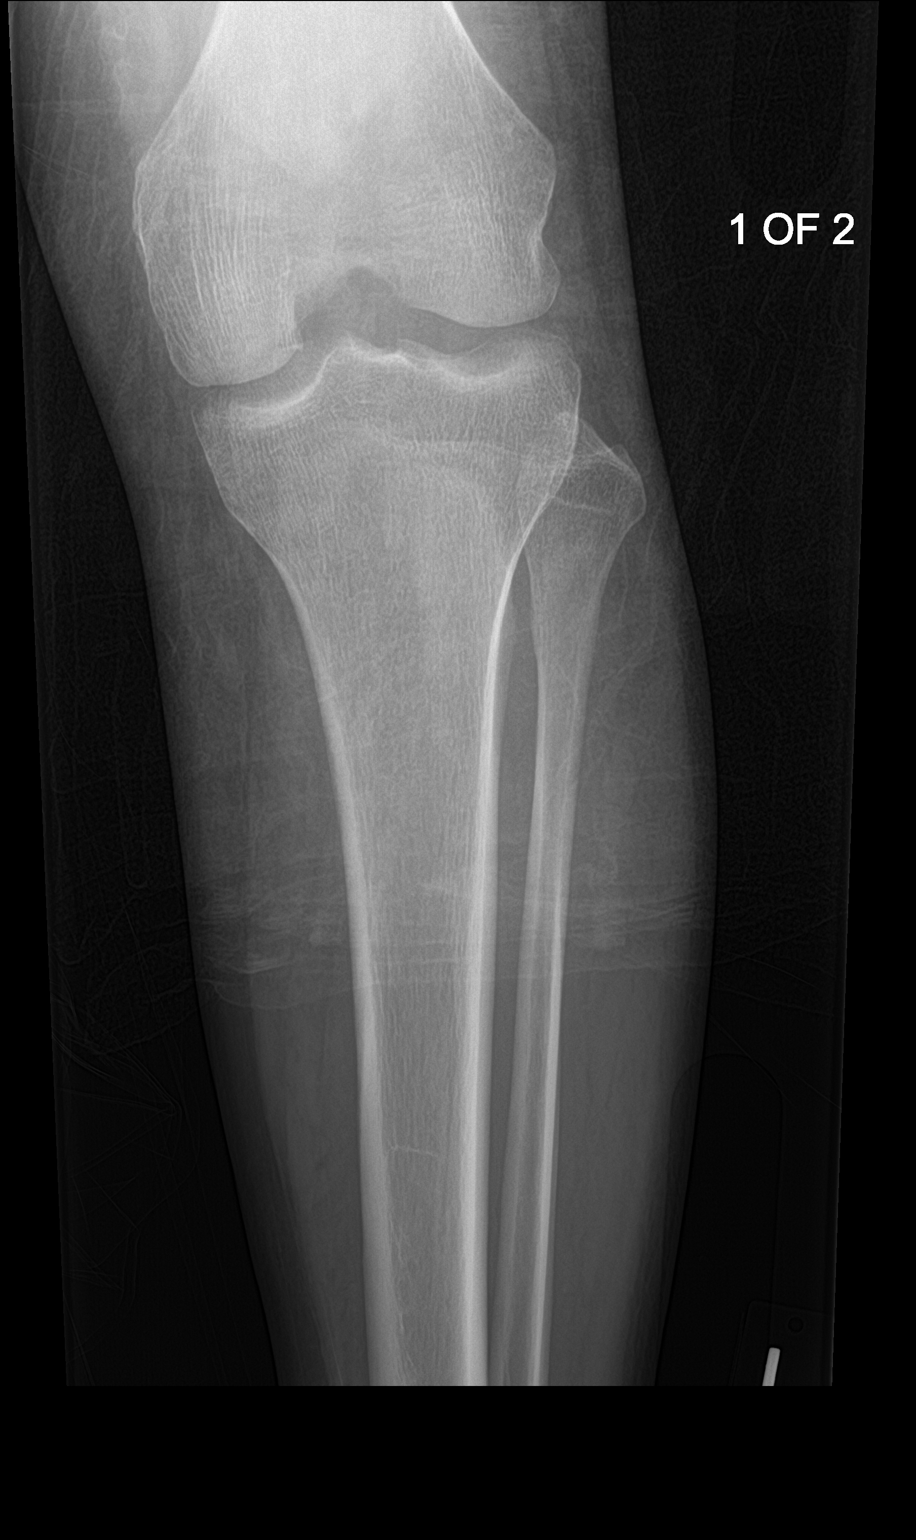

[4 of 4 positions shown; findings below may reference images not displayed]

FINDINGS: No acute fracture or dislocation about the left tibia/fibula. Acute
fracture involving the distal left femoral shaft partially
visualized. No acute soft tissue abnormality.
IMPRESSION: 1. No acute osseous abnormality about the left tibia/fibula.
2. Acute fracture involving the distal left fibular shaft, partially
visualized.

## 2019-08-31 IMAGING — CR DG KNEE 1-2V*L*
2 series · 2 of 2 positions shown · non-contrast
Comparison: None.

CLINICAL DATA: Initial evaluation for acute trauma, fall.

EXAM:
LEFT KNEE - 1-2 VIEW

[knee lat]
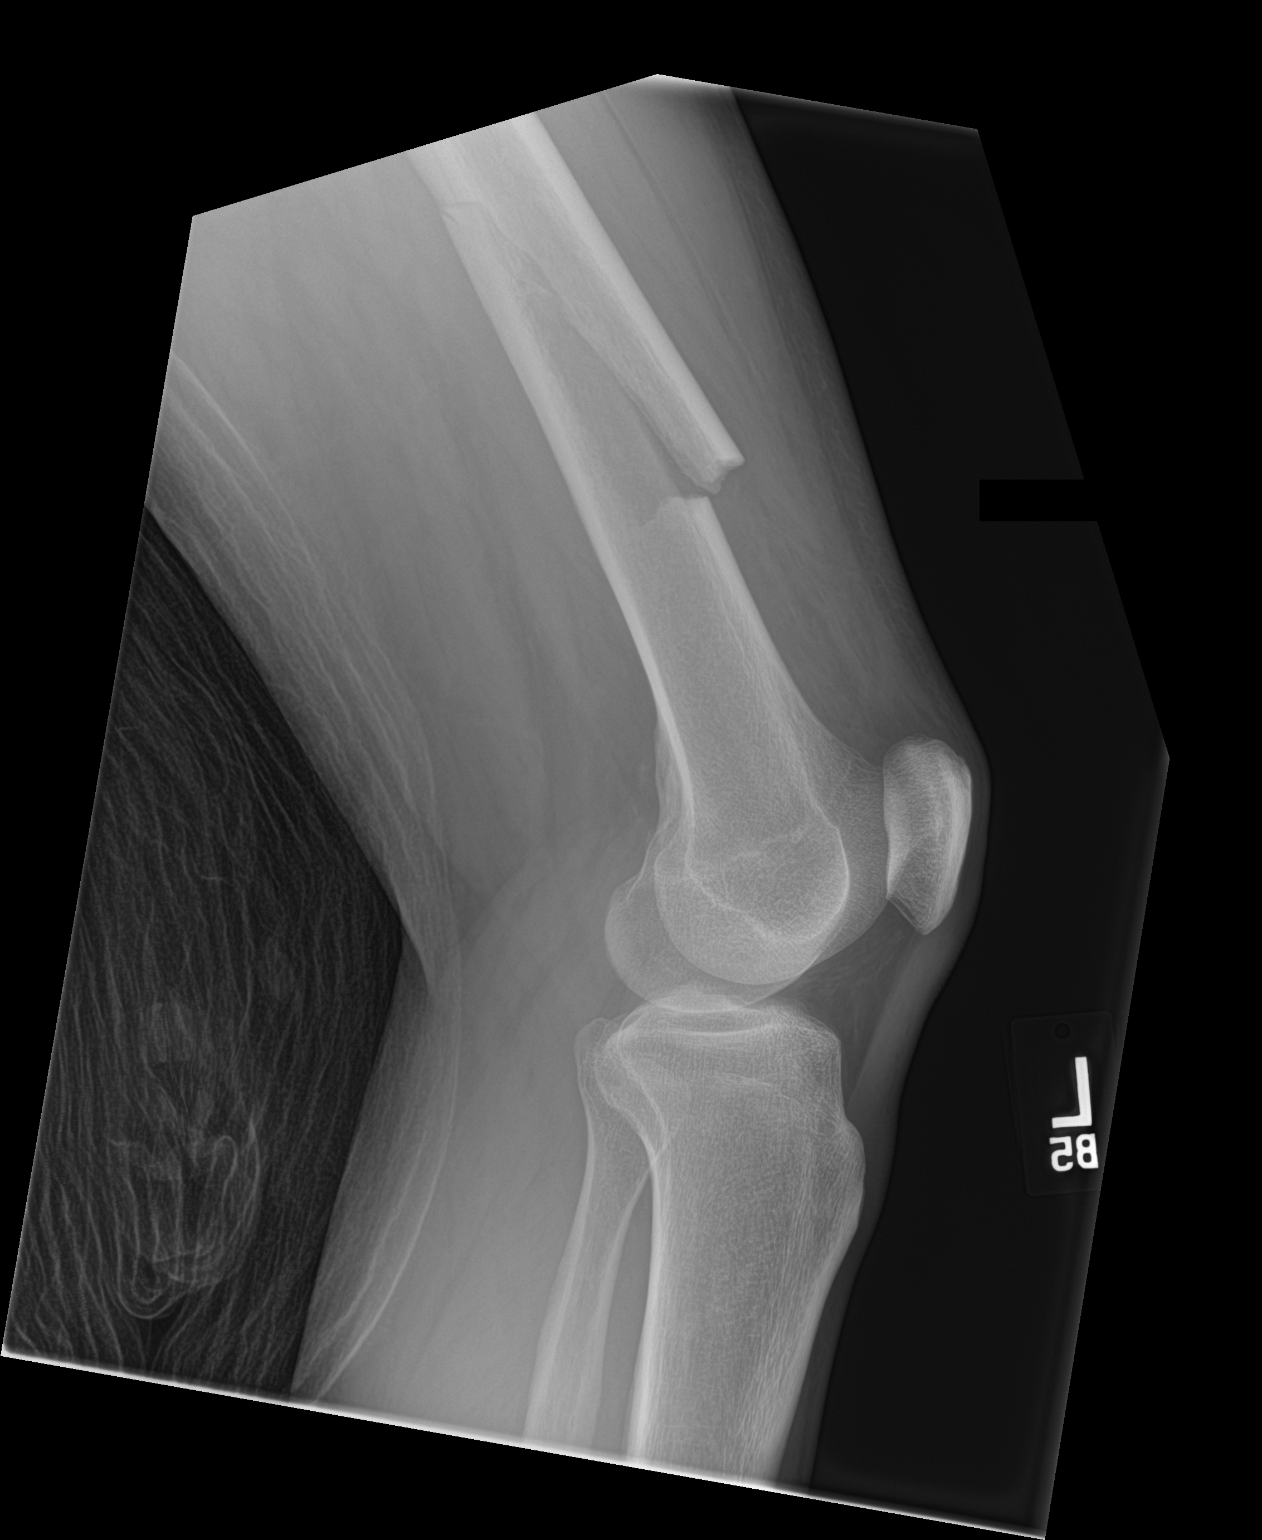

[knee ap]
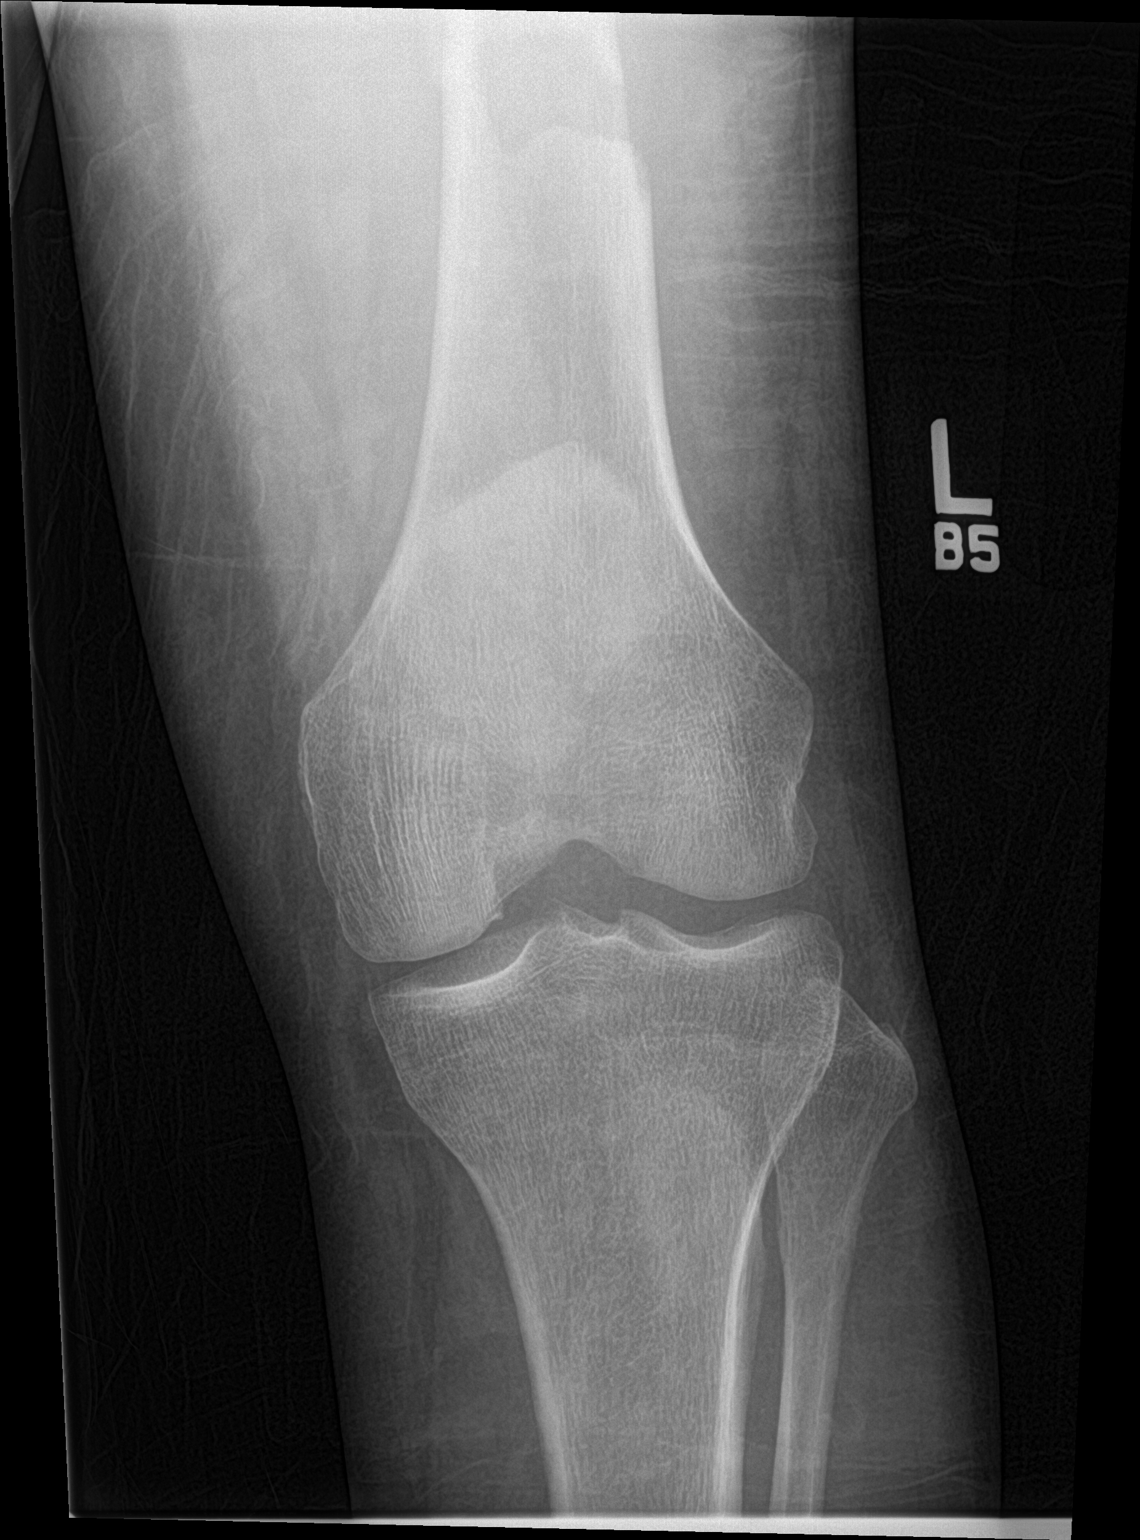

[2 of 2 positions shown; findings below may reference images not displayed]

FINDINGS: Acute comminuted and mildly displaced oblique fracture of the distal
left femoral shaft. No other acute fracture or dislocation. No joint
effusion. No acute soft tissue abnormality.
IMPRESSION: 1. Acute comminuted oblique fracture of the distal left femoral
shaft.

## 2019-09-01 IMAGING — DX DG FEMUR 2+V PORT*L*
1 series · 3 of 3 positions shown · non-contrast
Comparison: 3333 hours on the same day

CLINICAL DATA: Patient felt a pop in the left femur 1 getting up
from bed. Surgery today for repair of left femoral fracture.

EXAM:
LEFT FEMUR PORTABLE 2 VIEWS

[Series 1: femur · 0.14mm/px · 3 of 3 slices shown]
[im 1/3]
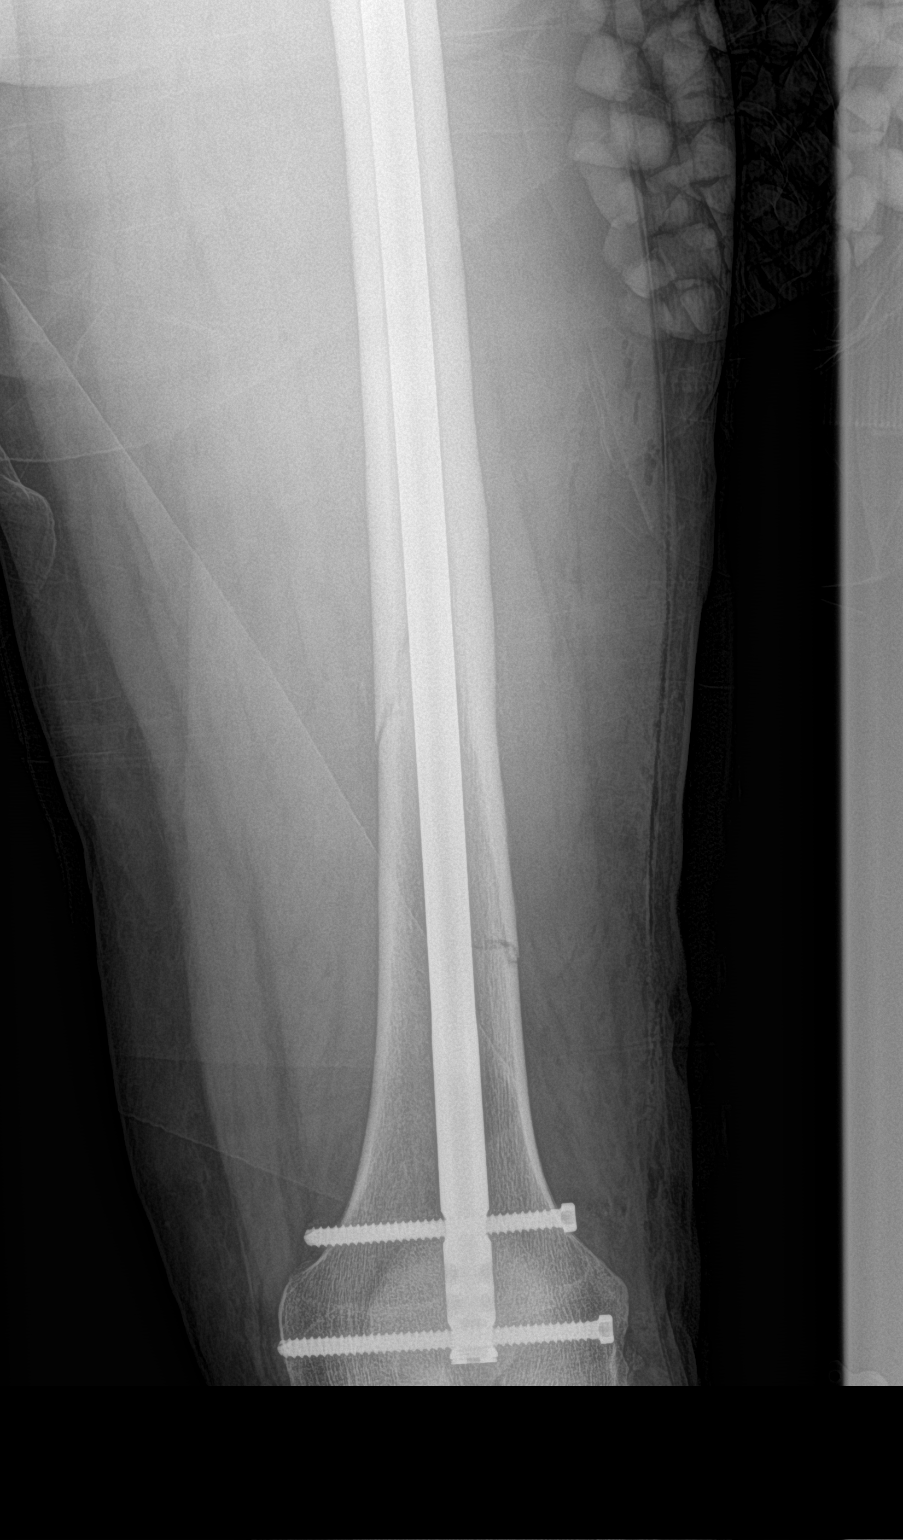
[im 2/3]
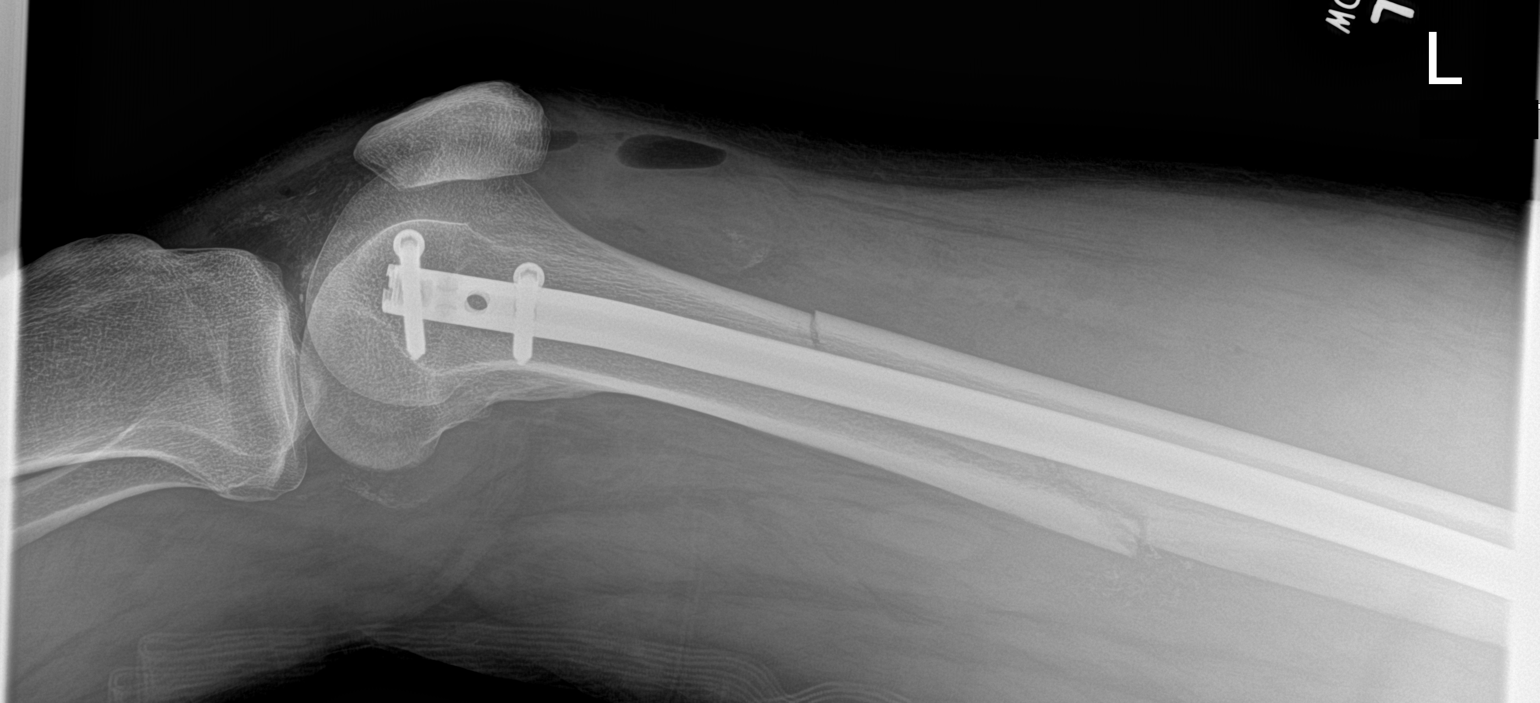
[im 3/3]
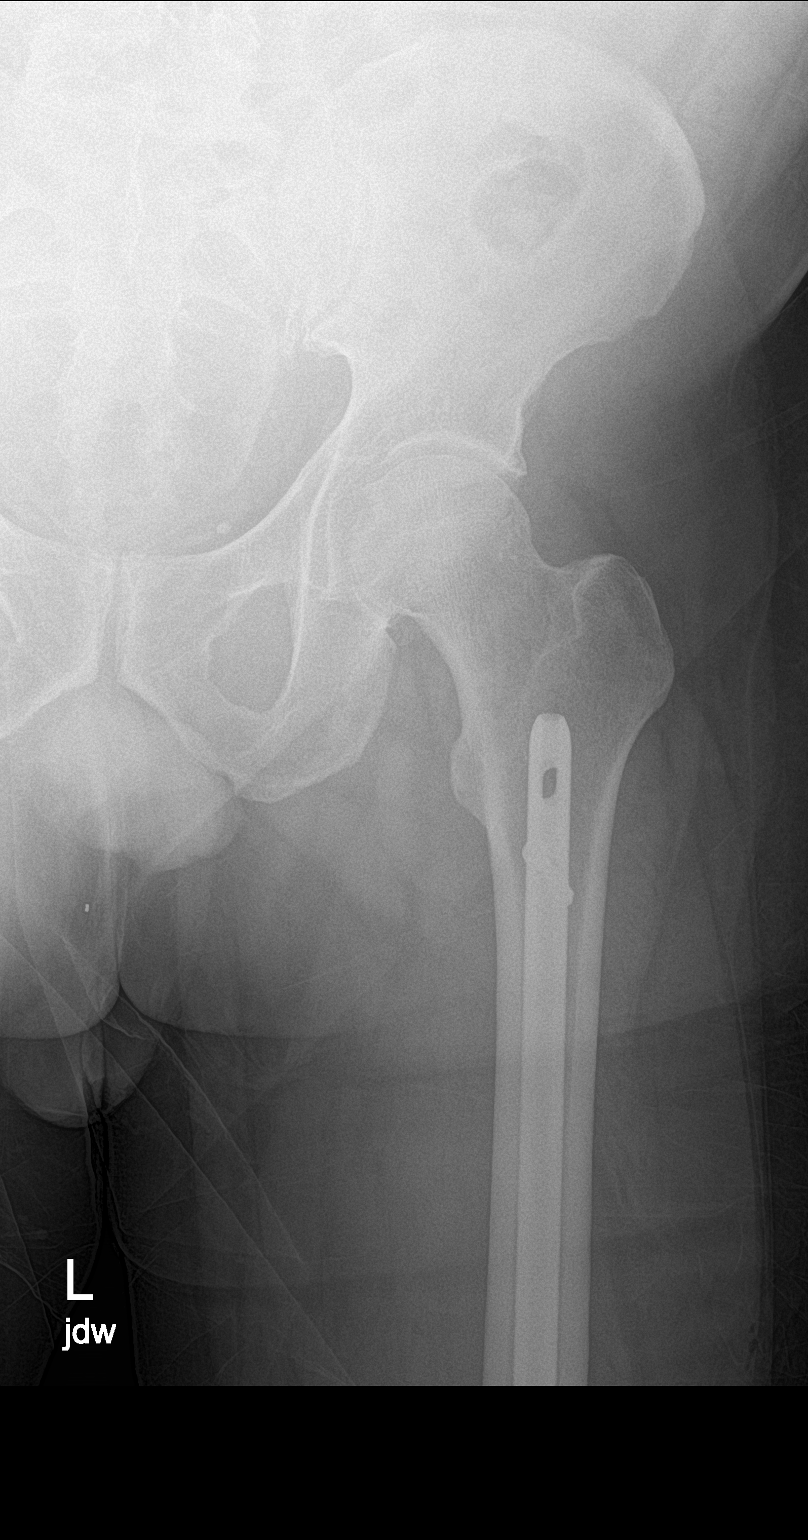

[3 of 3 positions shown; findings below may reference images not displayed]

FINDINGS: Stable appearing intramedullary nail fixation across a known distal
left femoral diaphyseal fracture of the femur. No significant change
in the appearance of the femoral fractures. Air-fluid level within
the suprapatellar compartment of the knee from postop change as well
soft tissue emphysema as before.
IMPRESSION: No new complicating features post reverse intramedullary nail
fixation of the left femur. Distal diaphyseal fractures of the left
femur are near anatomic.

## 2020-04-16 DIAGNOSIS — J0110 Acute frontal sinusitis, unspecified: Secondary | ICD-10-CM | POA: Diagnosis not present

## 2020-08-09 DIAGNOSIS — R062 Wheezing: Secondary | ICD-10-CM | POA: Diagnosis not present

## 2020-08-09 DIAGNOSIS — J069 Acute upper respiratory infection, unspecified: Secondary | ICD-10-CM | POA: Diagnosis not present

## 2020-08-09 DIAGNOSIS — H66001 Acute suppurative otitis media without spontaneous rupture of ear drum, right ear: Secondary | ICD-10-CM | POA: Diagnosis not present

## 2020-09-09 DIAGNOSIS — J0141 Acute recurrent pansinusitis: Secondary | ICD-10-CM | POA: Diagnosis not present

## 2020-09-09 DIAGNOSIS — J310 Chronic rhinitis: Secondary | ICD-10-CM | POA: Diagnosis not present

## 2020-10-22 DIAGNOSIS — J0141 Acute recurrent pansinusitis: Secondary | ICD-10-CM | POA: Diagnosis not present

## 2020-10-22 DIAGNOSIS — J321 Chronic frontal sinusitis: Secondary | ICD-10-CM | POA: Diagnosis not present

## 2020-10-22 DIAGNOSIS — J323 Chronic sphenoidal sinusitis: Secondary | ICD-10-CM | POA: Diagnosis not present

## 2020-10-22 DIAGNOSIS — J341 Cyst and mucocele of nose and nasal sinus: Secondary | ICD-10-CM | POA: Diagnosis not present

## 2020-10-22 DIAGNOSIS — J342 Deviated nasal septum: Secondary | ICD-10-CM | POA: Diagnosis not present

## 2020-10-22 DIAGNOSIS — J310 Chronic rhinitis: Secondary | ICD-10-CM | POA: Diagnosis not present

## 2021-03-12 DIAGNOSIS — Z125 Encounter for screening for malignant neoplasm of prostate: Secondary | ICD-10-CM | POA: Diagnosis not present

## 2021-03-12 DIAGNOSIS — Z13220 Encounter for screening for lipoid disorders: Secondary | ICD-10-CM | POA: Diagnosis not present

## 2021-03-12 DIAGNOSIS — R7309 Other abnormal glucose: Secondary | ICD-10-CM | POA: Diagnosis not present

## 2021-03-12 DIAGNOSIS — Z23 Encounter for immunization: Secondary | ICD-10-CM | POA: Diagnosis not present

## 2021-03-12 DIAGNOSIS — L03012 Cellulitis of left finger: Secondary | ICD-10-CM | POA: Diagnosis not present

## 2021-05-16 DIAGNOSIS — Z23 Encounter for immunization: Secondary | ICD-10-CM | POA: Diagnosis not present

## 2021-06-13 DIAGNOSIS — R062 Wheezing: Secondary | ICD-10-CM | POA: Diagnosis not present

## 2021-06-13 DIAGNOSIS — Z1211 Encounter for screening for malignant neoplasm of colon: Secondary | ICD-10-CM | POA: Diagnosis not present

## 2021-06-13 DIAGNOSIS — Z0000 Encounter for general adult medical examination without abnormal findings: Secondary | ICD-10-CM | POA: Diagnosis not present

## 2021-06-13 DIAGNOSIS — Z125 Encounter for screening for malignant neoplasm of prostate: Secondary | ICD-10-CM | POA: Diagnosis not present

## 2021-06-13 DIAGNOSIS — E785 Hyperlipidemia, unspecified: Secondary | ICD-10-CM | POA: Diagnosis not present

## 2022-06-23 DIAGNOSIS — E669 Obesity, unspecified: Secondary | ICD-10-CM | POA: Diagnosis not present

## 2022-06-23 DIAGNOSIS — Z1211 Encounter for screening for malignant neoplasm of colon: Secondary | ICD-10-CM | POA: Diagnosis not present

## 2022-06-23 DIAGNOSIS — Z0000 Encounter for general adult medical examination without abnormal findings: Secondary | ICD-10-CM | POA: Diagnosis not present

## 2022-06-23 DIAGNOSIS — Z125 Encounter for screening for malignant neoplasm of prostate: Secondary | ICD-10-CM | POA: Diagnosis not present

## 2022-06-23 DIAGNOSIS — E785 Hyperlipidemia, unspecified: Secondary | ICD-10-CM | POA: Diagnosis not present

## 2022-08-12 DIAGNOSIS — L821 Other seborrheic keratosis: Secondary | ICD-10-CM | POA: Diagnosis not present

## 2022-08-12 DIAGNOSIS — D2239 Melanocytic nevi of other parts of face: Secondary | ICD-10-CM | POA: Diagnosis not present

## 2022-08-12 DIAGNOSIS — D492 Neoplasm of unspecified behavior of bone, soft tissue, and skin: Secondary | ICD-10-CM | POA: Diagnosis not present

## 2023-02-08 DIAGNOSIS — R509 Fever, unspecified: Secondary | ICD-10-CM | POA: Diagnosis not present

## 2023-02-08 DIAGNOSIS — R0981 Nasal congestion: Secondary | ICD-10-CM | POA: Diagnosis not present

## 2023-02-08 DIAGNOSIS — U071 COVID-19: Secondary | ICD-10-CM | POA: Diagnosis not present

## 2023-02-08 DIAGNOSIS — J3489 Other specified disorders of nose and nasal sinuses: Secondary | ICD-10-CM | POA: Diagnosis not present

## 2023-02-22 DIAGNOSIS — M79606 Pain in leg, unspecified: Secondary | ICD-10-CM | POA: Diagnosis not present

## 2023-02-22 DIAGNOSIS — J309 Allergic rhinitis, unspecified: Secondary | ICD-10-CM | POA: Diagnosis not present

## 2023-02-22 DIAGNOSIS — H938X9 Other specified disorders of ear, unspecified ear: Secondary | ICD-10-CM | POA: Diagnosis not present

## 2023-05-21 ENCOUNTER — Other Ambulatory Visit (HOSPITAL_BASED_OUTPATIENT_CLINIC_OR_DEPARTMENT_OTHER): Payer: Self-pay

## 2023-05-21 DIAGNOSIS — R519 Headache, unspecified: Secondary | ICD-10-CM | POA: Diagnosis not present

## 2023-05-21 DIAGNOSIS — H9203 Otalgia, bilateral: Secondary | ICD-10-CM | POA: Diagnosis not present

## 2023-05-21 DIAGNOSIS — Z03818 Encounter for observation for suspected exposure to other biological agents ruled out: Secondary | ICD-10-CM | POA: Diagnosis not present

## 2023-05-21 DIAGNOSIS — J020 Streptococcal pharyngitis: Secondary | ICD-10-CM | POA: Diagnosis not present

## 2023-05-21 MED ORDER — PENICILLIN V POTASSIUM 500 MG PO TABS
500.0000 mg | ORAL_TABLET | Freq: Two times a day (BID) | ORAL | 0 refills | Status: AC
  Filled 2023-05-21: qty 20, 10d supply, fill #0

## 2023-05-26 ENCOUNTER — Other Ambulatory Visit (HOSPITAL_BASED_OUTPATIENT_CLINIC_OR_DEPARTMENT_OTHER): Payer: Self-pay

## 2023-05-26 DIAGNOSIS — J988 Other specified respiratory disorders: Secondary | ICD-10-CM | POA: Diagnosis not present

## 2023-05-26 DIAGNOSIS — U071 COVID-19: Secondary | ICD-10-CM | POA: Diagnosis not present

## 2023-05-26 MED ORDER — AZITHROMYCIN 250 MG PO TABS
ORAL_TABLET | ORAL | 0 refills | Status: DC
  Filled 2023-05-26: qty 6, 5d supply, fill #0

## 2023-06-08 ENCOUNTER — Other Ambulatory Visit (HOSPITAL_BASED_OUTPATIENT_CLINIC_OR_DEPARTMENT_OTHER): Payer: Self-pay

## 2023-06-08 DIAGNOSIS — J069 Acute upper respiratory infection, unspecified: Secondary | ICD-10-CM | POA: Diagnosis not present

## 2023-06-08 DIAGNOSIS — R509 Fever, unspecified: Secondary | ICD-10-CM | POA: Diagnosis not present

## 2023-06-08 DIAGNOSIS — Z03818 Encounter for observation for suspected exposure to other biological agents ruled out: Secondary | ICD-10-CM | POA: Diagnosis not present

## 2023-06-08 DIAGNOSIS — R042 Hemoptysis: Secondary | ICD-10-CM | POA: Diagnosis not present

## 2023-06-08 MED ORDER — BENZONATATE 200 MG PO CAPS
200.0000 mg | ORAL_CAPSULE | Freq: Three times a day (TID) | ORAL | 0 refills | Status: DC
  Filled 2023-06-08: qty 45, 15d supply, fill #0

## 2023-06-28 ENCOUNTER — Other Ambulatory Visit (HOSPITAL_BASED_OUTPATIENT_CLINIC_OR_DEPARTMENT_OTHER): Payer: Self-pay

## 2023-06-28 DIAGNOSIS — Z0000 Encounter for general adult medical examination without abnormal findings: Secondary | ICD-10-CM | POA: Diagnosis not present

## 2023-06-28 DIAGNOSIS — Z125 Encounter for screening for malignant neoplasm of prostate: Secondary | ICD-10-CM | POA: Diagnosis not present

## 2023-06-28 DIAGNOSIS — E785 Hyperlipidemia, unspecified: Secondary | ICD-10-CM | POA: Diagnosis not present

## 2023-06-28 MED ORDER — MOMETASONE FUROATE 0.1 % EX OINT
TOPICAL_OINTMENT | CUTANEOUS | 2 refills | Status: AC
  Filled 2023-06-28: qty 30, 30d supply, fill #0

## 2023-07-09 ENCOUNTER — Other Ambulatory Visit (HOSPITAL_BASED_OUTPATIENT_CLINIC_OR_DEPARTMENT_OTHER): Payer: Self-pay

## 2023-07-22 ENCOUNTER — Telehealth (INDEPENDENT_AMBULATORY_CARE_PROVIDER_SITE_OTHER): Payer: Self-pay | Admitting: Physician Assistant

## 2023-07-22 NOTE — Telephone Encounter (Signed)
 Confirmed appt & location [phone]

## 2023-07-23 ENCOUNTER — Ambulatory Visit (INDEPENDENT_AMBULATORY_CARE_PROVIDER_SITE_OTHER): Payer: BLUE CROSS/BLUE SHIELD | Admitting: Audiology

## 2023-07-23 ENCOUNTER — Encounter (INDEPENDENT_AMBULATORY_CARE_PROVIDER_SITE_OTHER): Payer: Self-pay

## 2023-07-23 ENCOUNTER — Ambulatory Visit (INDEPENDENT_AMBULATORY_CARE_PROVIDER_SITE_OTHER): Payer: BLUE CROSS/BLUE SHIELD | Admitting: Physician Assistant

## 2023-07-23 VITALS — BP 122/79 | HR 78 | Ht 69.0 in | Wt 200.0 lb

## 2023-07-23 DIAGNOSIS — H905 Unspecified sensorineural hearing loss: Secondary | ICD-10-CM

## 2023-07-23 DIAGNOSIS — H9319 Tinnitus, unspecified ear: Secondary | ICD-10-CM | POA: Diagnosis not present

## 2023-07-23 DIAGNOSIS — H903 Sensorineural hearing loss, bilateral: Secondary | ICD-10-CM

## 2023-07-23 DIAGNOSIS — H9313 Tinnitus, bilateral: Secondary | ICD-10-CM

## 2023-07-23 NOTE — Progress Notes (Signed)
  [address] [place], [place] [address] [phone]  Audiological Evaluation    Name: Andrew Pierce     DOB:   August 08, 1965      MRN:   [id number]                                                                                     Service Date: 07/23/2023     Accompanied by: unaccompanied   Patient comes today after Eyvonne Mechanic, PA-C sent a referral for a hearing evaluation due to concerns with tinnitus.   Symptoms Yes Details  Hearing loss  [x]  maybe  Tinnitus  [x]  Seems to be same in both ears today , if hear reate goes up may worsen, is constant and disturbing for the patient  Ear pain/ infections/pressure  []  Not today  Balance problems  []    Noise exposure history  [x]  occupational  Previous ear surgeries  []    Family history of hearing loss  []    Amplification  []    Other  []      Otoscopy: Right ear: Clear external ear canals and notable landmarks visualized on the tympanic membrane. Left ear:  Clear external ear canals and notable landmarks visualized on the tympanic membrane.  Tympanometry: Right ear: Type A- Normal external ear canal volume with normal middle ear pressure and tympanic membrane compliance Left ear: Type A- Normal external ear canal volume with normal middle ear pressure and tympanic membrane compliance    Pure tone Audiometry: Right ear- Normal hearing except for a mild to moderately severe sensorineural hearing loss from 3k Hz - 4k and at 12k Hz. Left ear-   Normal hearing except for a mild to moderately severe sensorineural hearing loss at 4k and at 12k Hz.  Speech Audiometry: Right ear- Speech Reception Threshold (SRT) was obtained at 15 dBHL. Left ear-Speech Reception Threshold (SRT) was obtained at 15 dBHL.   Word Recognition Score Tested using NU-6 (MLV) Right ear: 92% was obtained at a presentation level of 70 dBHL with contralateral masking which is deemed as  excellent. Left ear: 96% was obtained at a presentation  level of 65 dBHL with contralateral masking which is deemed as  excellent.   The hearing test results were completed under headphones and results are deemed to be of good reliability. Test technique:  conventional     Recommendations: Follow up with ENT as scheduled for today. Return for a hearing evaluation if concerns with hearing changes arise or per MD recommendation. Consider various tinnitus strategies, including the use of a sound generator, hearing aids, and/or tinnitus retraining therapy.    Ellisha Bankson MARIE LEROUX-MARTINEZ, AUD

## 2023-07-23 NOTE — Progress Notes (Signed)
 Dear Dr. Kateri Plummer, Here is my assessment for our mutual patient, Andrew Pierce. Thank you for allowing me the opportunity to care for your patient. Please do not hesitate to contact me should you have any other questions. Sincerely, Burna Forts PA-C  Otolaryngology Clinic Note Referring provider: Dr. Kateri Plummer HPI:  Andrew Pierce is a 58 y.o. male kindly referred by Dr. Kateri Plummer   The patient is a 1 YOM seen today for tinnitus. 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 He notes that over the last several years he has had increased ringing in both ears, described as high pitched and consistent. This is worse when its quiet. He denies any ear issues or complaints as a child, no head or neck surgery, trauma, or cancer. He occasionally has some minor dizziness with sitting to standing. No neurological complaints.    Independent Review of Additional Tests or Records:  07/27/2023 Audiogram was independently reviewed and interpreted by me and it reveals Right ear: Type A tympanogram; Normal hearing except mild to moderately sever sensorineural hearing loss from 3K Hz- 4K and at 12Kk Hz, SRT obtained at 15 dBHL, WRS 92% at 70 dBHL Left ear: Type A tympanogram; Normal hearing except for a mild sensorineural hearing loss at 4K and at 12k Hz, SRT obtained at 15 dBHL, WRS 96% at 15dBHL  SNHL= Sensorineural hearing loss      PMH/Meds/All/SocHx/FamHx/ROS:   Past Medical History:  Diagnosis Date   Abnormal stress test    Chest pain    Elevated LDL cholesterol level    Palpitations 02/17/2013   Event monitor 05/03/12-occasional tachycardia, paroxysmal atrial tachycardia, PACs heart rate at times in the 170s. Symptomatic.     Past Surgical History:  Procedure Laterality Date   CHOLECYSTECTOMY     FEMUR IM NAIL Left 11/02/2017   Procedure: INTRAMEDULLARY (IM) RETROGRADE FEMORAL NAILING;  Surgeon: Sheral Apley, MD;  Location: [facility] OR;  Service:  Orthopedics;  Laterality: Left;   HAND SURGERY Left    middle finger removed-table saw accident   KIDNEY STONE SURGERY     , Removal   LEFT HEART CATHETERIZATION WITH CORONARY ANGIOGRAM N/A 03/27/2013   Procedure: LEFT HEART CATHETERIZATION WITH CORONARY ANGIOGRAM;  Surgeon: Donato Schultz, MD;  Location: [facility] CATH LAB;  Service: Cardiovascular;  Laterality: N/A;    Family History  Problem Relation Age of Onset   Diabetes Mother    Hypertension Father    Heart disease Paternal Grandmother    Heart attack Paternal Grandmother    Stroke Paternal Grandmother      Social Connections: Not on file      Current Outpatient Medications:    aspirin EC 81 MG tablet, Take 1 tablet (81 mg total) by mouth 2 (two) times daily. For DVT prophylaxis, Disp: 60 tablet, Rfl: 0   docusate sodium (COLACE) 100 MG capsule, Take 1 capsule (100 mg total) by mouth 2 (two) times daily. To prevent constipation while taking pain medication., Disp: 60 capsule, Rfl: 0   fluticasone (FLONASE) 50 MCG/ACT nasal spray, Place into both nostrils daily., Disp: , Rfl:    levocetirizine (XYZAL) 5 MG tablet, Take 5 mg by mouth every evening., Disp: , Rfl:    methocarbamol (ROBAXIN) 500 MG tablet, Take 1 tablet (500 mg total) by mouth every 6 (six) hours as needed for muscle spasms., Disp: 40 tablet, Rfl: 0   mometasone (ELOCON) 0.1 % ointment, Apply 1 application to affected area Externally One to two  times a day for 2-4 weeks, then take med holiday as able, Disp: 30 g, Rfl: 2   omeprazole (PRILOSEC) 20 MG capsule, Take 1 capsule (20 mg total) by mouth daily. 30 days for gastroprotection while taking NSAIDs., Disp: 30 capsule, Rfl: 0   ondansetron (ZOFRAN) 4 MG tablet, Take 1 tablet (4 mg total) by mouth every 8 (eight) hours as needed for nausea or vomiting., Disp: 40 tablet, Rfl: 0   azithromycin (ZITHROMAX Z-PAK) 250 MG tablet, Take 2 tablets  on the first day, then 1 tablet daily for 4 days Orally Once a day 5 day(s) (Patient  not taking: Reported on 07/23/2023), Disp: 6 tablet, Rfl: 0   benzonatate (TESSALON) 200 MG capsule, Take 1 capsule Orally Three times a day as needed. (Patient not taking: Reported on 07/23/2023), Disp: 45 capsule, Rfl: 0   Physical Exam:   BP 122/79 (BP Location: Left Arm, Patient Position: Sitting)   Pulse 78   Ht 5\' 9"  (1.753 m)   Wt 200 lb (90.7 kg)   SpO2 94%   BMI 29.53 kg/m   Pertinent Findings  CN II-XII intact  Bilateral EAC clear and TM intact with well pneumatized middle ear spaces Weber 512: equal Rinne 512: AC > BC b/l  Anterior rhinoscopy: Septum midline; bilateral inferior turbinates with no hypertrophy No lesions of oral cavity/oropharynx; dentition wnl No obviously palpable neck masses/lymphadenopathy/thyromegaly No respiratory distress or stridor  Seprately Identifiable Procedures:  None  Impression & Plans:  Andrew Pierce is a 58 y.o. male with the following   Hearing loss/ Tinnitus-  The patient presents with sensorineural hearing loss. This is longstanding and likely at his baseline. He has a long history of noise exposure, this is likely the cause of his hearing loss and also the tinnitus. No red flags on exam today. I discussed hearing protection, monitoring for any changes, and the use of background noise for relief.  He is happy with today's plan and will follow up on an as needed basis.    - f/u as needed if any changes occur   Thank you for allowing me the opportunity to care for your patient. Please do not hesitate to contact me should you have any other questions.  Sincerely, Burna Forts PA-C [facility] Phone: [phone] Fax: [phone]  07/23/2023, 11:46 AM

## 2024-03-29 ENCOUNTER — Ambulatory Visit: Attending: Student in an Organized Health Care Education/Training Program | Admitting: Cardiology

## 2024-03-29 ENCOUNTER — Encounter: Payer: Self-pay | Admitting: Cardiology

## 2024-03-29 ENCOUNTER — Ambulatory Visit: Attending: Cardiology

## 2024-03-29 VITALS — BP 130/70 | HR 69 | Ht 69.0 in | Wt 210.0 lb

## 2024-03-29 DIAGNOSIS — I4719 Other supraventricular tachycardia: Secondary | ICD-10-CM

## 2024-03-29 DIAGNOSIS — R072 Precordial pain: Secondary | ICD-10-CM | POA: Diagnosis not present

## 2024-03-29 DIAGNOSIS — R0602 Shortness of breath: Secondary | ICD-10-CM

## 2024-03-29 MED ORDER — DILTIAZEM HCL ER COATED BEADS 120 MG PO CP24: 120.0000 mg | ORAL_CAPSULE | Freq: Every day | ORAL | 3 refills | Status: AC

## 2024-03-29 MED ORDER — METOPROLOL TARTRATE 50 MG PO TABS: 50.0000 mg | ORAL_TABLET | ORAL | 0 refills | Status: AC

## 2024-03-29 NOTE — Progress Notes (Signed)
 Cardiology Office Note:  .   Date:  03/29/2024  ID:  Andrew Pierce, DOB 02-21-1966, MRN [id number] PCP: Kip Righter, MD  [facility] Providers Cardiologist:  None    History of Present Illness: .   Andrew Pierce is a 58 y.o. male Discussed the use of AI scribe  History of Present Illness Andrew Pierce is a 58 year old male with paroxysmal atrial tachycardia who presents with palpitations.  He experiences recurrent palpitations that have increased in frequency and severity, occurring both at rest and during physical activity. These episodes last from a few seconds to up to thirty seconds. During prolonged episodes, he feels near-syncope and requires support to stand. The palpitations are described as 'real hard' and are sometimes accompanied by left arm and left side body pain, which he finds particularly concerning.  He has a history of paroxysmal atrial tachycardia and premature atrial contractions, diagnosed via an event monitor in 2013. He underwent a cardiac catheterization in November 2014. He was previously treated with digoxin  125 micrograms, which alleviated his symptoms, but he discontinued it several years ago. Another medication, possibly metoprolol, was discontinued due to adverse effects resembling flu-like symptoms.  He currently experiences shortness of breath, which is distressing to his wife. 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 His current medications include Xyzal at night, Prilosec or Nexium in the morning, and Flonase two squirts in each nostril every morning.       Studies Reviewed: SABRA   EKG Interpretation Date/Time:  Wednesday March 29 2024 16:01:15 EST Ventricular Rate:  69 PR Interval:  150 QRS Duration:  94 QT Interval:  378 QTC Calculation: 405 R Axis:   80  Text Interpretation: Normal sinus rhythm Normal ECG When compared with ECG of 27-Mar-2013 06:56, No  significant change was found Confirmed by Jeffrie Anes ([id number]) on 03/29/2024 4:17:03 PM    Results DIAGNOSTIC Cardiac catheterization: Normal coronary arteries, normal ejection fraction (03/27/2013) Risk Assessment/Calculations:            Physical Exam:   VS:  BP 130/70   Pulse 69   Ht 5' 9 (1.753 m)   Wt 210 lb (95.3 kg)   BMI 31.01 kg/m    Wt Readings from Last 3 Encounters:  03/29/24 210 lb (95.3 kg)  07/23/23 200 lb (90.7 kg)  11/01/17 193 lb (87.5 kg)    GEN: Well nourished, well developed in no acute distress NECK: No JVD; No carotid bruits CARDIAC: RRR, no murmurs, no rubs, no gallops RESPIRATORY:  Clear to auscultation without rales, wheezing or rhonchi  ABDOMEN: Soft, non-tender, non-distended EXTREMITIES:  No edema; No deformity   ASSESSMENT AND PLAN: .    Assessment and Plan Assessment & Plan Paroxysmal atrial tachycardia with palpitations Recurrent episodes of palpitations, sometimes lasting 15-30 seconds, causing near syncope. Episodes are increasing in frequency and occur at rest or with minimal exertion. Previous event monitor showed paroxysmal atrial tachycardia and PACs. Digoxin  was effective in the past but was discontinued due to concerns about CDL requirements. Considering alternative medication due to potential DOT concerns with digoxin . - Ordered Zio monitor for two weeks to capture episodes of palpitations. - Prescribed diltiazem CD 120 mg once daily to manage atrial tachycardia.  Chest pain and left arm pain, evaluating for coronary artery disease Intermittent chest pain and left arm pain associated with palpitations. Previous cardiac catheterization in 2014 showed normal coronary arteries. - Ordered coronary CT  scan to evaluate for coronary artery disease and stenosis.  Shortness of breath, evaluating for cardiac etiology Reports of shortness of breath.  - Ordered echocardiogram to assess cardiac function and rule out structural heart  disease.         Dispo: 6 mths APP  Signed, Oneil Parchment, MD

## 2024-03-29 NOTE — Progress Notes (Unsigned)
 Enrolled for [employer] to mail a ZIO XT long term holter monitor to the patients address on file.

## 2024-03-29 NOTE — Patient Instructions (Signed)
 Medication Instructions:  Please start Diltiazem CD 120 mg once daily. Continue all other medications as listed.  *If you need a refill on your cardiac medications before your next appointment, please call your pharmacy*  Testing/Procedures: Your physician has requested that you have an echocardiogram. Echocardiography is a painless test that uses sound waves to create images of your heart. It provides your doctor with information about the size and shape of your heart and how well your heart's chambers and valves are working. This procedure takes approximately one hour. There are no restrictions for this procedure. Please do NOT wear cologne, perfume, aftershave, or lotions (deodorant is allowed). Please arrive 15 minutes prior to your appointment time.  Please note: We ask at that you not bring children with you during ultrasound (echo/ vascular) testing. Due to room size and safety concerns, children are not allowed in the ultrasound rooms during exams. Our front office staff cannot provide observation of children in our lobby area while testing is being conducted. An adult accompanying a patient to their appointment will only be allowed in the ultrasound room at the discretion of the ultrasound technician under special circumstances. We apologize for any inconvenience.  ZIO XT- Long Term Monitor Instructions  Your physician has requested you wear a ZIO patch monitor for 14 days.  This is a single patch monitor. [employer] supplies one patch monitor per enrollment. Additional stickers are not available. Please do not apply patch if you will be having a Nuclear Stress Test,  Echocardiogram, Cardiac CT, MRI, or Chest Xray during the period you would be wearing the  monitor. The patch cannot be worn during these tests. You cannot remove and re-apply the  ZIO XT patch monitor.  Your ZIO patch monitor will be mailed 3 day USPS to your address on file. It may take 3-5 days  to receive your monitor  after you have been enrolled.  Once you have received your monitor, please review the enclosed instructions. Your monitor  has already been registered assigning a specific monitor serial # to you.  Billing and Patient Assistance Program Information  We have supplied [employer] with any of your insurance information on file for billing purposes. [employer] offers a sliding scale Patient Assistance Program for patients that do not have  insurance, or whose insurance does not completely cover the cost of the ZIO monitor.  You must apply for the Patient Assistance Program to qualify for this discounted rate.  To apply, please call [employer] at [phone], select option 4, select option 2, ask to apply for  Patient Assistance Program. Meredeth will ask your household income, and how many people  are in your household. They will quote your out-of-pocket cost based on that information.  [employer] will also be able to set up a 32-month, interest-free payment plan if needed.  Applying the monitor   Shave hair from upper left chest.  Hold abrader disc by orange tab. Rub abrader in 40 strokes over the upper left chest as  indicated in your monitor instructions.  Clean area with 4 enclosed alcohol pads. Let dry.  Apply patch as indicated in monitor instructions. Patch will be placed under collarbone on left  side of chest with arrow pointing upward.  Rub patch adhesive wings for 2 minutes. Remove white label marked 1. Remove the white  label marked 2. Rub patch adhesive wings for 2 additional minutes.  While looking in a mirror, press and release button in center of patch. A small green light will  flash 3-4 times. This will be your only indicator that the monitor has been turned on.  Do not shower for the first 24 hours. You may shower after the first 24 hours.  Press the button if you feel a symptom. You will hear a small click. Record Date, Time and  Symptom in the Patient Logbook.  When you are  ready to remove the patch, follow instructions on the last 2 pages of Patient  Logbook. Stick patch monitor onto the last page of Patient Logbook.  Place Patient Logbook in the blue and white box. Use locking tab on box and tape box closed  securely. The blue and white box has prepaid postage on it. Please place it in the mailbox as  soon as possible. Your physician should have your test results approximately 7 days after the  monitor has been mailed back to [facility].  Call [facility] Customer Care at [phone] if you have questions regarding  your ZIO XT patch monitor. Call them immediately if you see an orange light blinking on your  monitor.  If your monitor falls off in less than 4 days, contact our Monitor department at [phone].  If your monitor becomes loose or falls off after 4 days call [employer] at [phone] for  suggestions on securing your monitor    Your cardiac CT will be scheduled at:  [facility] [address]  [place], [place] [address]  Please enter the parking lot using the [address] entrance and use the FREE valet service at the patient drop-off area. Enter the building and check-in with registration on the main floor.  Please follow these instructions carefully (unless otherwise directed):  An IV will be required for this test and Nitroglycerin  will be given.  Hold all erectile dysfunction medications at least 3 days (72 hrs) prior to test. (Ie viagra, cialis, sildenafil, tadalafil, etc)   On the Night Before the Test: Be sure to Drink plenty of water. Do not consume any caffeinated/decaffeinated beverages or chocolate 12 hours prior to your test. Do not take any antihistamines 12 hours prior to your test.  On the Day of the Test: Drink plenty of water until 1 hour prior to the test. Do not eat any food 1 hour prior to test. You may take your regular medications prior to the test.  Take metoprolol  (Lopressor) two hours prior to test. If you take Furosemide/Hydrochlorothiazide/Spironolactone/Chlorthalidone, please HOLD on the morning of the test. Patients who wear a continuous glucose monitor MUST remove the device prior to scanning.  After the Test: Drink plenty of water. After receiving IV contrast, you may experience a mild flushed feeling. This is normal. On occasion, you may experience a mild rash up to 24 hours after the test. This is not dangerous. If this occurs, you can take Benadryl  25 mg, Zyrtec, Claritin, or Allegra and increase your fluid intake. (Patients taking Tikosyn should avoid Benadryl , and may take Zyrtec, Claritin, or Allegra) If you experience trouble breathing, this can be serious. If it is severe call 911 IMMEDIATELY. If it is mild, please call our office.  We will call to schedule your test 2-4 weeks out understanding that some insurance companies will need an authorization prior to the service being performed.   For more information and frequently asked questions, please visit our website : [email or web address]  For non-scheduling related questions, please contact the cardiac imaging nurse navigator should you have any questions/concerns: Cardiac Imaging Nurse Navigators Direct Office  Dial: [phone]   For scheduling needs, including cancellations and rescheduling, please call Brittany, [phone].  Follow-Up: At [facility], you and your health needs are our priority.  As part of our continuing mission to provide you with exceptional heart care, our providers are all part of one team.  This team includes your primary Cardiologist (physician) and Advanced Practice Providers or APPs (Physician Assistants and Nurse Practitioners) who all work together to provide you with the care you need, when you need it.  Your next appointment:   6 month(s)  Provider:   One of our Advanced Practice Providers (APPs): Morse Clause, PA-C  Lamarr Satterfield, NP Miriam Shams, NP  Olivia Pavy, PA-C Josefa Beauvais, NP  Leontine Salen, PA-C Orren Fabry, PA-C  Hao Meng, PA-C Ernest Dick, NP  Damien Braver, NP Jon Hails, PA-C  Waddell Donath, PA-C    Dayna Dunn, PA-C  Scott Weaver, PA-C Lum Louis, NP Katlyn West, NP Callie Goodrich, PA-C  Xika Zhao, NP Sheng Haley, PA-C    Kathleen Johnson, PA-C       We recommend signing up for the patient portal called MyChart.  Sign up information is provided on this After Visit Summary.  MyChart is used to connect with patients for Virtual Visits (Telemedicine).  Patients are able to view lab/test results, encounter notes, upcoming appointments, etc.  Non-urgent messages can be sent to your provider as well.   To learn more about what you can do with MyChart, go to [email or web address].

## 2024-04-18 ENCOUNTER — Telehealth (HOSPITAL_COMMUNITY): Payer: Self-pay | Admitting: Emergency Medicine

## 2024-04-18 NOTE — Telephone Encounter (Signed)
 Attempted to call patient regarding upcoming cardiac CT appointment. Left message on voicemail with name and callback number Rockwell Alexandria RN Navigator Cardiac Imaging [facility] [phone] Office [phone] Cell

## 2024-04-19 ENCOUNTER — Ambulatory Visit (HOSPITAL_COMMUNITY)
Admission: RE | Admit: 2024-04-19 | Discharge: 2024-04-19 | Disposition: A | Source: Ambulatory Visit | Attending: Cardiology | Admitting: Cardiology

## 2024-04-19 ENCOUNTER — Other Ambulatory Visit: Payer: Self-pay | Admitting: Cardiology

## 2024-04-19 ENCOUNTER — Ambulatory Visit (HOSPITAL_COMMUNITY)
Admission: RE | Admit: 2024-04-19 | Discharge: 2024-04-19 | Disposition: A | Source: Ambulatory Visit | Attending: Cardiology

## 2024-04-19 DIAGNOSIS — I2510 Atherosclerotic heart disease of native coronary artery without angina pectoris: Secondary | ICD-10-CM | POA: Diagnosis not present

## 2024-04-19 DIAGNOSIS — R931 Abnormal findings on diagnostic imaging of heart and coronary circulation: Secondary | ICD-10-CM | POA: Insufficient documentation

## 2024-04-19 DIAGNOSIS — R072 Precordial pain: Secondary | ICD-10-CM | POA: Insufficient documentation

## 2024-04-19 MED ORDER — NITROGLYCERIN 0.4 MG SL SUBL
0.8000 mg | SUBLINGUAL_TABLET | Freq: Once | SUBLINGUAL | Status: AC
  Administered 2024-04-19: 0.8 mg via SUBLINGUAL

## 2024-04-19 MED ORDER — IOHEXOL 350 MG/ML SOLN
100.0000 mL | Freq: Once | INTRAVENOUS | Status: AC | PRN
  Administered 2024-04-19: 100 mL via INTRAVENOUS

## 2024-04-23 ENCOUNTER — Ambulatory Visit: Payer: Self-pay | Admitting: Cardiology

## 2024-04-30 DIAGNOSIS — I4719 Other supraventricular tachycardia: Secondary | ICD-10-CM | POA: Diagnosis not present

## 2024-05-08 ENCOUNTER — Ambulatory Visit (HOSPITAL_COMMUNITY)
Admission: RE | Admit: 2024-05-08 | Discharge: 2024-05-08 | Disposition: A | Discharge status: Home / Self Care | Source: Ambulatory Visit | Attending: Cardiology | Admitting: Cardiology

## 2024-05-08 DIAGNOSIS — R0602 Shortness of breath: Secondary | ICD-10-CM | POA: Diagnosis not present

## 2024-05-08 DIAGNOSIS — I4719 Other supraventricular tachycardia: Secondary | ICD-10-CM | POA: Diagnosis not present

## 2024-05-08 DIAGNOSIS — R0600 Dyspnea, unspecified: Secondary | ICD-10-CM

## 2024-05-08 LAB — ECHOCARDIOGRAM COMPLETE
Area-P 1/2: 3.1 cm2
S' Lateral: 2.3 cm
